# Patient Record
Sex: Male | Born: 1985 | Race: Black or African American | Hispanic: No | Marital: Single | Smoking: Current every day smoker
Health system: Southern US, Community
[De-identification: ages and names within clinical notes are randomized; demographics above are authoritative.]

## PROBLEM LIST (undated history)

## (undated) HISTORY — PX: TONSILLECTOMY: SUR1361

## (undated) HISTORY — PX: OTHER SURGICAL HISTORY: SHX169

---
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
## 2008-07-14 ENCOUNTER — Emergency Department: Payer: Self-pay | Admitting: Emergency Medicine

## 2008-07-16 ENCOUNTER — Emergency Department: Payer: Self-pay | Admitting: Emergency Medicine

## 2010-10-09 ENCOUNTER — Emergency Department: Payer: Self-pay | Admitting: Emergency Medicine

## 2012-01-25 ENCOUNTER — Emergency Department: Payer: Self-pay | Admitting: Emergency Medicine

## 2012-01-25 LAB — RAPID INFLUENZA A&B ANTIGENS

## 2012-06-24 ENCOUNTER — Emergency Department: Payer: Self-pay | Admitting: Emergency Medicine

## 2012-09-26 ENCOUNTER — Emergency Department: Payer: Self-pay | Admitting: Emergency Medicine

## 2012-09-29 ENCOUNTER — Emergency Department: Payer: Self-pay | Admitting: Emergency Medicine

## 2013-10-07 ENCOUNTER — Emergency Department: Payer: Self-pay | Admitting: Emergency Medicine

## 2013-10-10 ENCOUNTER — Emergency Department: Payer: Self-pay | Admitting: Emergency Medicine

## 2013-11-11 ENCOUNTER — Emergency Department: Payer: Self-pay | Admitting: Student

## 2013-12-12 ENCOUNTER — Emergency Department: Payer: Self-pay | Admitting: Emergency Medicine

## 2013-12-28 ENCOUNTER — Emergency Department: Payer: Self-pay | Admitting: Emergency Medicine

## 2014-01-20 ENCOUNTER — Emergency Department: Payer: Self-pay | Admitting: Emergency Medicine

## 2014-04-19 ENCOUNTER — Emergency Department
Admit: 2014-04-19 | Disposition: A | Discharge status: Home / Self Care | Payer: Self-pay | Admitting: Emergency Medicine

## 2014-04-22 ENCOUNTER — Emergency Department
Admit: 2014-04-22 | Disposition: A | Discharge status: Home / Self Care | Payer: Self-pay | Admitting: Emergency Medicine

## 2014-07-16 ENCOUNTER — Encounter: Payer: Self-pay | Admitting: Emergency Medicine

## 2014-07-16 ENCOUNTER — Emergency Department
Admission: EM | Admit: 2014-07-16 | Discharge: 2014-07-16 | Disposition: A | Discharge status: Home / Self Care | Payer: Self-pay | Attending: Emergency Medicine | Admitting: Emergency Medicine

## 2014-07-16 DIAGNOSIS — R42 Dizziness and giddiness: Secondary | ICD-10-CM | POA: Insufficient documentation

## 2014-07-16 DIAGNOSIS — Z720 Tobacco use: Secondary | ICD-10-CM | POA: Insufficient documentation

## 2014-07-16 DIAGNOSIS — J029 Acute pharyngitis, unspecified: Secondary | ICD-10-CM | POA: Insufficient documentation

## 2014-07-16 LAB — POCT RAPID STREP A: Streptococcus, Group A Screen (Direct): NEGATIVE

## 2014-07-16 NOTE — ED Notes (Signed)
Patient presents to the ED with complaint of sore throat, headache, and dizziness since yesterday.  Patient states the dizziness is intermittent.  Patient states the sore throat was worse when he first woke up this morning.  Patient is alert and oriented x 4 and is in no obvious distress at this time.  Patient ambulatory without difficulty.  Patient is not photophobic at this time.

## 2014-07-16 NOTE — ED Notes (Signed)
Pt states that he started with a sore throat yesterday, states some runny nose and cough at times, pt reports that when he woke up this am he felt worse

## 2014-07-16 NOTE — ED Provider Notes (Signed)
[facility] Emergency Department Provider Note  Time seen: 9:17 AM  I have reviewed the triage vital signs and the nursing notes.   HISTORY  Chief Complaint Sore Throat; Headache; and Dizziness    HPI Tony Conley is a 29 y.o. male with no past medical history who presents the emergency department with a sore throat and headache. According to the patient he developed a sore throat yesterday, was some pain with swallowing. He states this morning he felt a little dizzy with a headache in addition to the sore throat so he came to the emergency department for evaluation. Denies fever, neck pain, nausea, vomiting, photo or phonophobia. Patient describes the sore throat as moderate, worse with swallowing, dull.     History reviewed. No pertinent past medical history.  There are no active problems to display for this patient.   Past Surgical History  Procedure Laterality Date  . Tonsillectomy      No current outpatient prescriptions on file.  Allergies Review of patient's allergies indicates no known allergies.  No family history on file.  Social History History  Substance Use Topics  . Smoking status: Current Every Day Smoker -- 0.50 packs/day    Types: Cigarettes  . Smokeless tobacco: Not on file  . Alcohol Use: Not on file    Review of Systems Constitutional: Negative for fever. ENT: Negative for congestion. Positive for sore throat. Cardiovascular: Negative for chest pain. Respiratory: Negative for shortness of breath. Gastrointestinal: Negative for abdominal pain, vomiting and diarrhea. Musculoskeletal: Negative for back pain. Skin: Negative for rash. Neurological: Positive for mild headache. 10-point ROS otherwise negative.  ____________________________________________   PHYSICAL EXAM:  VITAL SIGNS: ED Triage Vitals  Enc Vitals Group     BP 07/16/14 0909 130/75 mmHg     Pulse Rate 07/16/14 0909 108     Resp 07/16/14 0909 16      Temp 07/16/14 0909 98.3 F (36.8 C)     Temp Source 07/16/14 0909 Oral     SpO2 07/16/14 0909 97 %     Weight 07/16/14 0909 135 lb (61.236 kg)     Height 07/16/14 0909 5\' 9"  (1.753 m)     Head Cir --      Peak Flow --      Pain Score 07/16/14 0910 7     Pain Loc --      Pain Edu? --      Excl. in GC? --     Constitutional: Alert and oriented. Well appearing and in no distress. Eyes: Normal exam ENT   Head: Normocephalic and atraumatic.   Nose: No congestion/rhinnorhea.   Mouth/Throat: Mucous membranes are moist. Mild pharyngeal erythema without exudate, no unilateral tonsillar swelling, no uvular deviation. Mild anterior cervical lymphadenopathy bilaterally. Cardiovascular: Normal rate, regular rhythm. No murmur Respiratory: Normal respiratory effort without tachypnea nor retractions. Breath sounds are clear and equal bilaterally. No wheezes/rales/rhonchi. Gastrointestinal: Soft and nontender. No distention.  Musculoskeletal: Nontender with normal range of motion in all extremities. Neurologic:  Normal speech and language. No gross focal neurologic deficits Skin:  Skin is warm, dry and intact.  Psychiatric: Mood and affect are normal. Speech and behavior are normal.  ____________________________________________   INITIAL IMPRESSION / ASSESSMENT AND PLAN / ED COURSE  Pertinent labs & imaging results that were available during my care of the patient were reviewed by me and considered in my medical decision making (see chart for details).  Patient with a good/normal exam besides mild pharyngeal  erythema, with mild anterior cervical lymphadenopathy. We will check a rapid strep. If positive we will treat with antibiotics, if negative I discussed supportive care with patient including Chloraseptic spray, Tylenol or Motrin as needed for discomfort, and increasing fluids. Patient is agreeable to plan.  ____________________________________________   FINAL CLINICAL  IMPRESSION(S) / ED DIAGNOSES  Pharyngitis   Minna Antis, MD 07/16/14 [phone]

## 2014-07-16 NOTE — Discharge Instructions (Signed)
General Headache Without Cause A headache is pain or discomfort felt around the head or neck area. The specific cause of a headache may not be found. There are many causes and types of headaches. A few common ones are:  Tension headaches.  Migraine headaches.  Cluster headaches.  Chronic daily headaches. HOME CARE INSTRUCTIONS   Keep all follow-up appointments with your caregiver or any specialist referral.  Only take over-the-counter or prescription medicines for pain or discomfort as directed by your caregiver.  Lie down in a dark, quiet room when you have a headache.  Keep a headache journal to find out what may trigger your migraine headaches. For example, write down:  What you eat and drink.  How much sleep you get.  Any change to your diet or medicines.  Try massage or other relaxation techniques.  Put ice packs or heat on the head and neck. Use these 3 to 4 times per day for 15 to 20 minutes each time, or as needed.  Limit stress.  Sit up straight, and do not tense your muscles.  Quit smoking if you smoke.  Limit alcohol use.  Decrease the amount of caffeine you drink, or stop drinking caffeine.  Eat and sleep on a regular schedule.  Get 7 to 9 hours of sleep, or as recommended by your caregiver.  Keep lights dim if bright lights bother you and make your headaches worse. SEEK MEDICAL CARE IF:   You have problems with the medicines you were prescribed.  Your medicines are not working.  You have a change from the usual headache.  You have nausea or vomiting. SEEK IMMEDIATE MEDICAL CARE IF:   Your headache becomes severe.  You have a fever.  You have a stiff neck.  You have loss of vision.  You have muscular weakness or loss of muscle control.  You start losing your balance or have trouble walking.  You feel faint or pass out.  You have severe symptoms that are different from your first symptoms. MAKE SURE YOU:   Understand these  instructions.  Will watch your condition.  Will get help right away if you are not doing well or get worse. Document Released: 12/23/2004 Document Revised: 03/17/2011 Document Reviewed: 01/08/2011 [facility] Patient Information 2015 [place], [place]. This information is not intended to replace advice given to you by your health care provider. Make sure you discuss any questions you have with your health care provider.  Pharyngitis Pharyngitis is a sore throat (pharynx). There is redness, pain, and swelling of your throat. HOME CARE   Drink enough fluids to keep your pee (urine) clear or pale yellow.  Only take medicine as told by your doctor.  You may get sick again if you do not take medicine as told. Finish your medicines, even if you start to feel better.  Do not take aspirin.  Rest.  Rinse your mouth (gargle) with salt water ( tsp of salt per 1 qt of water) every 1-2 hours. This will help the pain.  If you are not at risk for choking, you can suck on hard candy or sore throat lozenges. GET HELP IF:  You have large, tender lumps on your neck.  You have a rash.  You cough up green, yellow-brown, or bloody spit. GET HELP RIGHT AWAY IF:   You have a stiff neck.  You drool or cannot swallow liquids.  You throw up (vomit) or are not able to keep medicine or liquids down.  You have very  bad pain that does not go away with medicine.  You have problems breathing (not from a stuffy nose). MAKE SURE YOU:   Understand these instructions.  Will watch your condition.  Will get help right away if you are not doing well or get worse. Document Released: 06/11/2007 Document Revised: 10/13/2012 Document Reviewed: 08/30/2012 [facility] Patient Information 2015 [place], [place]. This information is not intended to replace advice given to you by your health care provider. Make sure you discuss any questions you have with your health care provider.    Please follow-up with your primary  care provider this week for recheck. Please use Tylenol or Motrin as needed for fever or discomfort, as written on the box. Please drink plenty of fluids over the next several days, and use over-the-counter Chloraseptic spray for symptom relief. Return to the emergency department for any worsening pain, fever, worsening headache, or any other symptom personally concerning to your self.

## 2014-07-18 LAB — CULTURE, GROUP A STREP (THRC)

## 2014-10-21 ENCOUNTER — Encounter: Payer: Self-pay | Admitting: Emergency Medicine

## 2014-10-21 ENCOUNTER — Emergency Department
Admission: EM | Admit: 2014-10-21 | Discharge: 2014-10-21 | Disposition: A | Discharge status: Home / Self Care | Payer: Self-pay | Attending: Emergency Medicine | Admitting: Emergency Medicine

## 2014-10-21 DIAGNOSIS — Z720 Tobacco use: Secondary | ICD-10-CM | POA: Insufficient documentation

## 2014-10-21 DIAGNOSIS — J029 Acute pharyngitis, unspecified: Secondary | ICD-10-CM

## 2014-10-21 DIAGNOSIS — J069 Acute upper respiratory infection, unspecified: Secondary | ICD-10-CM | POA: Insufficient documentation

## 2014-10-21 DIAGNOSIS — B349 Viral infection, unspecified: Secondary | ICD-10-CM | POA: Insufficient documentation

## 2014-10-21 LAB — POCT RAPID STREP A: Streptococcus, Group A Screen (Direct): NEGATIVE

## 2014-10-21 MED ORDER — PSEUDOEPH-BROMPHEN-DM 30-2-10 MG/5ML PO SYRP: 5.0000 mL | ORAL_SOLUTION | Freq: Four times a day (QID) | ORAL | Status: DC | PRN

## 2014-10-21 MED ORDER — LIDOCAINE VISCOUS 2 % MT SOLN: 20.0000 mL | OROMUCOSAL | Status: DC | PRN

## 2014-10-21 NOTE — ED Provider Notes (Signed)
[facility] Emergency Department Provider Note  ____________________________________________  Time seen: Approximately 11:23 AM  I have reviewed the triage vital signs and the nursing notes.   HISTORY  Chief Complaint Sore Throat    HPI Tony Conley is a 29 y.o. male patient complaining of sore throat and nasal congestion for 2 days. Patient also stated this been coughing. Patient denies any nausea vomiting diarrhea. Patient denies any fever or chills associated this complaint. Patient rating his sore throat pain as a 2 over 3. Patient is able to tolerate food and fluids. No palliative measures taken for this complaint.   History reviewed. No pertinent past medical history.  There are no active problems to display for this patient.   Past Surgical History  Procedure Laterality Date  . Tonsillectomy      Current Outpatient Rx  Name  Route  Sig  Dispense  Refill  . brompheniramine-pseudoephedrine-DM 30-2-10 MG/5ML syrup   Oral   Take 5 mLs by mouth 4 (four) times daily as needed.   120 mL   0   . lidocaine (XYLOCAINE) 2 % solution   Mouth/Throat   Use as directed 20 mLs in the mouth or throat as needed for mouth pain.   100 mL   0     Allergies Review of patient's allergies indicates no known allergies.  History reviewed. No pertinent family history.  Social History Social History  Substance Use Topics  . Smoking status: Current Every Day Smoker -- 0.50 packs/day    Types: Cigarettes  . Smokeless tobacco: None  . Alcohol Use: None    Review of Systems Constitutional: No fever/chills Eyes: No visual changes. ENT: Sore throat. Sinus congestion. Cardiovascular: Denies chest pain. Respiratory: Denies shortness of breath. Gastrointestinal: No abdominal pain.  No nausea, no vomiting.  No diarrhea.  No constipation. Genitourinary: Negative for dysuria. Musculoskeletal: Negative for back pain. Skin: Negative for  rash. Neurological: Negative for headaches, focal weakness or numbness. 10-point ROS otherwise negative.  ____________________________________________   PHYSICAL EXAM:  VITAL SIGNS: ED Triage Vitals  Enc Vitals Group     BP 10/21/14 1052 113/60 mmHg     Pulse Rate 10/21/14 1052 84     Resp 10/21/14 1052 16     Temp 10/21/14 1052 98.9 F (37.2 C)     Temp Source 10/21/14 1052 Oral     SpO2 10/21/14 1052 97 %     Weight 10/21/14 1052 144 lb (65.318 kg)     Height 10/21/14 1052  (1.727 m)     Head Cir --      Peak Flow --      Pain Score 10/21/14 1027 2     Pain Loc --      Pain Edu? --      Excl. in GC? --     Constitutional: Alert and oriented. Well appearing and in no acute distress. Eyes: Conjunctivae are normal. PERRL. EOMI. Head: Atraumatic. Nose: Nasal congestion/rhinnorhea. Edematous nasal turbinates. No guarding palpation of sinus. Mouth/Throat: Mucous membranes are moist.  Oropharynx erythematous. Neck: No stridor.  No cervical spine tenderness to palpation. Hematological/Lymphatic/Immunilogical: No cervical lymphadenopathy. Cardiovascular: Normal rate, regular rhythm. Grossly normal heart sounds.  Good peripheral circulation. Respiratory: Normal respiratory effort.  No retractions. Lungs CTAB. Gastrointestinal: Soft and nontender. No distention. No abdominal bruits. No CVA tenderness. Musculoskeletal: No lower extremity tenderness nor edema.  No joint effusions. Neurologic:  Normal speech and language. No gross focal neurologic deficits are appreciated. No  gait instability. Skin:  Skin is warm, dry and intact. No rash noted. Psychiatric: Mood and affect are normal. Speech and behavior are normal.  ____________________________________________   LABS (all labs ordered are listed, but only abnormal results are displayed)  Labs Reviewed - No data to  display ____________________________________________  EKG   ____________________________________________  RADIOLOGY   ____________________________________________   PROCEDURES  Procedure(s) performed: None  Critical Care performed: No  ____________________________________________   INITIAL IMPRESSION / ASSESSMENT AND PLAN / ED COURSE  Pertinent labs & imaging results that were available during my care of the patient were reviewed by me and considered in my medical decision making (see chart for details).  Respiratory infection and pharyngitis. Discussed negative rapid strep findings with patient advised culture is pending. Patient given a prescription for Bromfed-DM and viscous lidocaine. Patient given a work note for today. Patient [facility]. ____________________________________________   FINAL CLINICAL IMPRESSION(S) / ED DIAGNOSES  Final diagnoses:  URI (upper respiratory infection)  Pharyngitis with viral syndrome      Joni ReiningRonald K Eliah Ozawa, PA-C 10/21/14 1156  Phineas SemenGraydon Goodman, MD 10/21/14 1240

## 2014-10-21 NOTE — ED Notes (Signed)
Reports sore throat and congestion. No resp distress

## 2014-10-21 NOTE — ED Notes (Signed)
NAD noted at time of D/C. Pt denies questions or concerns. Pt ambulatory to the lobby at this time.  

## 2014-11-03 ENCOUNTER — Encounter: Payer: Self-pay | Admitting: Emergency Medicine

## 2014-11-03 ENCOUNTER — Emergency Department
Admission: EM | Admit: 2014-11-03 | Discharge: 2014-11-03 | Disposition: A | Discharge status: Home / Self Care | Payer: Self-pay | Attending: Emergency Medicine | Admitting: Emergency Medicine

## 2014-11-03 DIAGNOSIS — Z720 Tobacco use: Secondary | ICD-10-CM | POA: Insufficient documentation

## 2014-11-03 DIAGNOSIS — L03116 Cellulitis of left lower limb: Secondary | ICD-10-CM | POA: Insufficient documentation

## 2014-11-03 MED ORDER — SULFAMETHOXAZOLE-TRIMETHOPRIM 800-160 MG PO TABS: 1.0000 | ORAL_TABLET | Freq: Two times a day (BID) | ORAL | Status: DC

## 2014-11-03 NOTE — Discharge Instructions (Signed)

## 2014-11-03 NOTE — ED Notes (Signed)
Pt to ed with c/o ? Bug bite to left upper thigh on the back of the leg.  Pt states he noticed area last Saturday and no relief of pain since.

## 2014-11-03 NOTE — ED Provider Notes (Signed)
[facility] Emergency Department Analiya Porco Note  ____________________________________________  Time seen: Approximately 3:53 PM  I have reviewed the triage vital signs and the nursing notes.   HISTORY  Chief Complaint Insect Bite    HPI Tony Conley is a 29 y.o. male who presents to the emergency department for evaluation of a bite or abscess that is on his left posterior thigh.He states that he has not noticed any drainage. The area started 6 days ago. He denies fever or previous skin infections.   History reviewed. No pertinent past medical history.  There are no active problems to display for this patient.   Past Surgical History  Procedure Laterality Date  . Tonsillectomy      Current Outpatient Rx  Name  Route  Sig  Dispense  Refill  . brompheniramine-pseudoephedrine-DM 30-2-10 MG/5ML syrup   Oral   Take 5 mLs by mouth 4 (four) times daily as needed.   120 mL   0   . lidocaine (XYLOCAINE) 2 % solution   Mouth/Throat   Use as directed 20 mLs in the mouth or throat as needed for mouth pain.   100 mL   0   . sulfamethoxazole-trimethoprim (BACTRIM DS,SEPTRA DS) 800-160 MG tablet   Oral   Take 1 tablet by mouth 2 (two) times daily.   20 tablet   0     Allergies Review of patient's allergies indicates no known allergies.  History reviewed. No pertinent family history.  Social History Social History  Substance Use Topics  . Smoking status: Current Every Day Smoker -- 0.50 packs/day    Types: Cigarettes  . Smokeless tobacco: None  . Alcohol Use: No    Review of Systems   Constitutional: No fever/chills Eyes: No visual changes. ENT: No congestion or rhinorrhea Cardiovascular: Denies chest pain. Respiratory: Denies shortness of breath. Gastrointestinal: No abdominal pain.  No nausea, no vomiting.  No diarrhea.  No constipation. Genitourinary: Negative for dysuria. Musculoskeletal: Negative for back pain. Skin: Positive  for abscess on the left thigh Neurological: Negative for headaches, focal weakness or numbness.  10-point ROS otherwise negative.  ____________________________________________   PHYSICAL EXAM:  VITAL SIGNS: ED Triage Vitals  Enc Vitals Group     BP 11/03/14 0950 110/69 mmHg     Pulse Rate 11/03/14 0950 87     Resp 11/03/14 0950 20     Temp 11/03/14 0950 98.2 F (36.8 C)     Temp Source 11/03/14 0950 Oral     SpO2 11/03/14 0950 100 %     Weight 11/03/14 0950 145 lb (65.772 kg)     Height 11/03/14 0950 5\' 9"  (1.753 m)     Head Cir --      Peak Flow --      Pain Score 11/03/14 0951 10     Pain Loc --      Pain Edu? --      Excl. in GC? --     Constitutional: Alert and oriented. Well appearing and in no acute distress. Eyes: Conjunctivae are normal. PERRL. EOMI. Head: Atraumatic. Nose: No congestion/rhinnorhea. Mouth/Throat: Mucous membranes are moist.  Oropharynx non-erythematous. No oral lesions. Neck: No stridor. Cardiovascular: Normal rate, regular rhythm.  Good peripheral circulation. Respiratory: Normal respiratory effort.  No retractions. Lungs CTAB. Gastrointestinal: Soft and nontender. No distention. No abdominal bruits.  Musculoskeletal: No lower extremity tenderness nor edema.  No joint effusions. Neurologic:  Normal speech and language. No gross focal neurologic deficits are appreciated. Speech is  normal. No gait instability. Skin:  Approximately 1 cm indurated area with mild erythema and a small pinpoint opening in the center located on the posterior aspect of his left upper thigh.; Negative for petechiae.  Psychiatric: Mood and affect are normal. Speech and behavior are normal.  ____________________________________________   LABS (all labs ordered are listed, but only abnormal results are displayed)  Labs Reviewed - No data to  display ____________________________________________  EKG   ____________________________________________  RADIOLOGY   ____________________________________________   PROCEDURES  Procedure(s) performed: None ____________________________________________   INITIAL IMPRESSION / ASSESSMENT AND PLAN / ED COURSE  Pertinent labs & imaging results that were available during my care of the patient were reviewed by me and considered in my medical decision making (see chart for details).  No indication for I and D today.  Patient was advised to follow up with the primary care Abby Stines for symptoms that are not improving over the next 2-3 days. He was advised to return to the emergency department for symptoms that change or worsen if unable to schedule an appointment with the primary care Atlanta Pelto or specialist. ____________________________________________   FINAL CLINICAL IMPRESSION(S) / ED DIAGNOSES  Final diagnoses:  Cellulitis of left thigh       Chinita Pester, FNP 11/03/14 1556  Phineas Semen, MD 11/04/14 [phone]

## 2015-03-08 ENCOUNTER — Emergency Department
Admission: EM | Admit: 2015-03-08 | Discharge: 2015-03-08 | Disposition: A | Discharge status: Home / Self Care | Payer: Self-pay | Attending: Emergency Medicine | Admitting: Emergency Medicine

## 2015-03-08 DIAGNOSIS — F17210 Nicotine dependence, cigarettes, uncomplicated: Secondary | ICD-10-CM | POA: Insufficient documentation

## 2015-03-08 DIAGNOSIS — Z792 Long term (current) use of antibiotics: Secondary | ICD-10-CM | POA: Insufficient documentation

## 2015-03-08 DIAGNOSIS — H6503 Acute serous otitis media, bilateral: Secondary | ICD-10-CM | POA: Insufficient documentation

## 2015-03-08 DIAGNOSIS — J0110 Acute frontal sinusitis, unspecified: Secondary | ICD-10-CM | POA: Insufficient documentation

## 2015-03-08 MED ORDER — AMOXICILLIN 500 MG PO CAPS: 500.0000 mg | ORAL_CAPSULE | Freq: Three times a day (TID) | ORAL | Status: DC

## 2015-03-08 MED ORDER — FLUTICASONE PROPIONATE 50 MCG/ACT NA SUSP: 2.0000 | Freq: Every day | NASAL | Status: DC

## 2015-03-08 NOTE — Discharge Instructions (Signed)
Serous Otitis Media Serous otitis media is fluid in the middle ear space. This space contains the bones for hearing and air. Air in the middle ear space helps to transmit sound.  The air gets there through the eustachian tube. This tube goes from the back of the nose (nasopharynx) to the middle ear space. It keeps the pressure in the middle ear the same as the outside world. It also helps to drain fluid from the middle ear space. CAUSES  Serous otitis media occurs when the eustachian tube gets blocked. Blockage can come from:  Ear infections.  Colds and other upper respiratory infections.  Allergies.  Irritants such as cigarette smoke.  Sudden changes in air pressure (such as descending in an airplane).  Enlarged adenoids.  A mass in the nasopharynx. During colds and upper respiratory infections, the middle ear space can become temporarily filled with fluid. This can happen after an ear infection also. Once the infection clears, the fluid will generally drain out of the ear through the eustachian tube. If it does not, then serous otitis media occurs. SIGNS AND SYMPTOMS   Hearing loss.  A feeling of fullness in the ear, without pain.  Young children may not show any symptoms but may show slight behavioral changes, such as agitation, ear pulling, or crying. DIAGNOSIS  Serous otitis media is diagnosed by an ear exam. Tests may be done to check on the movement of the eardrum. Hearing exams may also be done. TREATMENT  The fluid most often goes away without treatment. If allergy is the cause, allergy treatment may be helpful. Fluid that persists for several months may require minor surgery. A small tube is placed in the eardrum to:  Drain the fluid.  Restore the air in the middle ear space. In certain situations, antibiotic medicines are used to avoid surgery. Surgery may be done to remove enlarged adenoids (if this is the cause). HOME CARE INSTRUCTIONS   Keep children away from  tobacco smoke.  Keep all follow-up visits as directed by your health care provider. SEEK MEDICAL CARE IF:   Your hearing is not better in 3 months.  Your hearing is worse.  You have ear pain.  You have drainage from the ear.  You have dizziness.  You have serous otitis media only in one ear or have any bleeding from your nose (epistaxis).  You notice a lump on your neck. MAKE SURE YOU:  Understand these instructions.   Will watch your condition.   Will get help right away if you are not doing well or get worse.    This information is not intended to replace advice given to you by your health care provider. Make sure you discuss any questions you have with your health care provider.   Document Released: 03/15/2003 Document Revised: 01/13/2014 Document Reviewed: 07/20/2012 Elsevier Interactive Patient Education 2016 [employer] Inc.  Sinus Rinse WHAT IS A SINUS RINSE? A sinus rinse is a simple home treatment that is used to rinse your sinuses with a sterile mixture of salt and water (saline solution). Sinuses are air-filled spaces in your skull behind the bones of your face and forehead that open into your nasal cavity. You will use the following:  Saline solution.  Neti pot or spray bottle. This releases the saline solution into your nose and through your sinuses. Neti pots and spray bottles can be purchased at [employer], a health food store, or online. WHEN WOULD I DO A SINUS RINSE? A sinus rinse  can help to clear mucus, dirt, dust, or pollen from the nasal cavity. You may do a sinus rinse when you have a cold, a virus, nasal allergy symptoms, a sinus infection, or stuffiness in the nose or sinuses. If you are considering a sinus rinse:  Ask your child's health care provider before performing a sinus rinse on your child.  Do not do a sinus rinse if you have had ear or nasal surgery, ear infection, or blocked ears. HOW DO I DO A SINUS RINSE?  Wash your  hands.  Disinfect your device according to the directions provided and then dry it.  Use the solution that comes with your device or one that is sold separately in stores. Follow the mixing directions on the package.  Fill your device with the amount of saline solution as directed by the device instructions.  Stand over a sink and tilt your head sideways over the sink.  Place the spout of the device in your upper nostril (the one closer to the ceiling).  Gently pour or squeeze the saline solution into the nasal cavity. The liquid should drain to the lower nostril if you are not overly congested.  Gently blow your nose. Blowing too hard may cause ear pain.  Repeat in the other nostril.  Clean and rinse your device with clean water and then air-dry it. ARE THERE RISKS OF A SINUS RINSE?  Sinus rinse is generally very safe and effective. However, there are a few risks, which include:   A burning sensation in the sinuses. This may happen if you do not make the saline solution as directed. Make sure to follow all directions when making the saline solution.  Infection from contaminated water. This is rare, but possible.  Nasal irritation.   This information is not intended to replace advice given to you by your health care provider. Make sure you discuss any questions you have with your health care provider.   Document Released: 07/20/2013 Document Reviewed: 07/20/2013 Elsevier Interactive Patient Education 2016 [employer].  Sinusitis, Adult Sinusitis is redness, soreness, and inflammation of the paranasal sinuses. Paranasal sinuses are air pockets within the bones of your face. They are located beneath your eyes, in the middle of your forehead, and above your eyes. In healthy paranasal sinuses, mucus is able to drain out, and air is able to circulate through them by way of your nose. However, when your paranasal sinuses are inflamed, mucus and air can become trapped. This can allow  bacteria and other germs to grow and cause infection. Sinusitis can develop quickly and last only a short time (acute) or continue over a long period (chronic). Sinusitis that lasts for more than 12 weeks is considered chronic. CAUSES Causes of sinusitis include:  Allergies.  Structural abnormalities, such as displacement of the cartilage that separates your nostrils (deviated septum), which can decrease the air flow through your nose and sinuses and affect sinus drainage.  Functional abnormalities, such as when the small hairs (cilia) that line your sinuses and help remove mucus do not work properly or are not present. SIGNS AND SYMPTOMS Symptoms of acute and chronic sinusitis are the same. The primary symptoms are pain and pressure around the affected sinuses. Other symptoms include:  Upper toothache.  Earache.  Headache.  Bad breath.  Decreased sense of smell and taste.  A cough, which worsens when you are lying flat.  Fatigue.  Fever.  Thick drainage from your nose, which often is green and may contain pus (  purulent).  Swelling and warmth over the affected sinuses. DIAGNOSIS Your health care provider will perform a physical exam. During your exam, your health care provider may perform any of the following to help determine if you have acute sinusitis or chronic sinusitis:  Look in your nose for signs of abnormal growths in your nostrils (nasal polyps).  Tap over the affected sinus to check for signs of infection.  View the inside of your sinuses using an imaging device that has a light attached (endoscope). If your health care provider suspects that you have chronic sinusitis, one or more of the following tests may be recommended:  Allergy tests.  Nasal culture. A sample of mucus is taken from your nose, sent to a lab, and screened for bacteria.  Nasal cytology. A sample of mucus is taken from your nose and examined by your health care provider to determine if your  sinusitis is related to an allergy. TREATMENT Most cases of acute sinusitis are related to a viral infection and will resolve on their own within 10 days. Sometimes, medicines are prescribed to help relieve symptoms of both acute and chronic sinusitis. These may include pain medicines, decongestants, nasal steroid sprays, or saline sprays. However, for sinusitis related to a bacterial infection, your health care provider will prescribe antibiotic medicines. These are medicines that will help kill the bacteria causing the infection. Rarely, sinusitis is caused by a fungal infection. In these cases, your health care provider will prescribe antifungal medicine. For some cases of chronic sinusitis, surgery is needed. Generally, these are cases in which sinusitis recurs more than 3 times per year, despite other treatments. HOME CARE INSTRUCTIONS  Drink plenty of water. Water helps thin the mucus so your sinuses can drain more easily.  Use a humidifier.  Inhale steam 3-4 times a day (for example, sit in the bathroom with the shower running).  Apply a warm, moist washcloth to your face 3-4 times a day, or as directed by your health care provider.  Use saline nasal sprays to help moisten and clean your sinuses.  Take medicines only as directed by your health care provider.  If you were prescribed either an antibiotic or antifungal medicine, finish it all even if you start to feel better. SEEK IMMEDIATE MEDICAL CARE IF:  You have increasing pain or severe headaches.  You have nausea, vomiting, or drowsiness.  You have swelling around your face.  You have vision problems.  You have a stiff neck.  You have difficulty breathing.   This information is not intended to replace advice given to you by your health care provider. Make sure you discuss any questions you have with your health care provider.   Document Released: 12/23/2004 Document Revised: 01/13/2014 Document Reviewed:  01/07/2011 Elsevier Interactive Patient Education [employer]   Take the antibiotic as directed. Start a daily allergy medicine + decongestant (pseudoephedrine) for sinus pressures and drainage. Rinse the sinuses with saline solution.

## 2015-03-08 NOTE — ED Notes (Signed)
Pt a/o. Left ear pain. No other symptoms. Ear drum red.

## 2015-03-08 NOTE — ED Notes (Signed)
Pt c/o left ear pain for the past week. 

## 2015-03-13 NOTE — ED Provider Notes (Signed)
[facility] Emergency Department Provider Note ____________________________________________  Time seen: 1048  I have reviewed the triage vital signs and the nursing notes.  HISTORY  Chief Complaint  Otalgia  He denies HPI Tony Conley is a 30 y.o. male sensitivity for language and left ear pain for the last week. He denies any interim fevers, chills, sweats. He denies any drainage from the ear or any irritation with manipulating the ear canal. He does note some sinus congestion and some mild frontal headache pain.He rates his discomfort a 6/10 in triage.  No past medical history on file.  There are no active problems to display for this patient.   Past Surgical History  Procedure Laterality Date  . Tonsillectomy      Current Outpatient Rx  Name  Route  Sig  Dispense  Refill  . amoxicillin (AMOXIL) 500 MG capsule   Oral   Take 1 capsule (500 mg total) by mouth 3 (three) times daily.   30 capsule   0   . brompheniramine-pseudoephedrine-DM 30-2-10 MG/5ML syrup   Oral   Take 5 mLs by mouth 4 (four) times daily as needed.   120 mL   0   . fluticasone (FLONASE) 50 MCG/ACT nasal spray   Each Nare   Place 2 sprays into both nostrils daily.   16 g   0   . lidocaine (XYLOCAINE) 2 % solution   Mouth/Throat   Use as directed 20 mLs in the mouth or throat as needed for mouth pain.   100 mL   0   . sulfamethoxazole-trimethoprim (BACTRIM DS,SEPTRA DS) 800-160 MG tablet   Oral   Take 1 tablet by mouth 2 (two) times daily.   20 tablet   0     Allergies Review of patient's allergies indicates no known allergies.  No family history on file.  Social History Social History  Substance Use Topics  . Smoking status: Current Every Day Smoker -- 0.50 packs/day    Types: Cigarettes  . Smokeless tobacco: Not on file  . Alcohol Use: No    Review of Systems  Constitutional: Negative for fever. Eyes: Negative for visual changes. ENT: Negative  for sore throat. Left ear pain as above. Cardiovascular: Negative for chest pain. Respiratory: Negative for shortness of breath. Gastrointestinal: Negative for abdominal pain, vomiting and diarrhea. Genitourinary: Negative for dysuria. Musculoskeletal: Negative for back pain. Skin: Negative for rash. Neurological: Negative for headaches, focal weakness or numbness. ____________________________________________  PHYSICAL EXAM:  VITAL SIGNS: ED Triage Vitals  Enc Vitals Group     BP 03/08/15 1013 120/62 mmHg     Pulse Rate 03/08/15 1013 71     Resp 03/08/15 1013 18     Temp 03/08/15 1013 97.7 F (36.5 C)     Temp Source 03/08/15 1013 Oral     SpO2 03/08/15 1013 98 %     Weight 03/08/15 1013 145 lb (65.772 kg)     Height 03/08/15 1013  (1.753 m)     Head Cir --      Peak Flow --      Pain Score 03/08/15 1017 6     Pain Loc --      Pain Edu? --      Excl. in GC? --    Constitutional: Alert and oriented. Well appearing and in no distress. Head: Normocephalic and atraumatic. Tender to palp over the frontal & maxillary sinuses.      Eyes: Conjunctivae are normal. PERRL. Normal  extraocular movements      Ears: Canals clear. TMs intact bilaterally. Bilateral TMs erythematous, bulging, with serous effusion noted.   Nose: No congestion/rhinorrhea.   Mouth/Throat: Mucous membranes are moist.   Neck: Supple. No thyromegaly. Hematological/Lymphatic/Immunological: No cervical lymphadenopathy. Cardiovascular: Normal rate, regular rhythm.  Respiratory: Normal respiratory effort. No wheezes/rales/rhonchi. Musculoskeletal: Nontender with normal range of motion in all extremities.  Neurologic:  Normal gait without ataxia. Normal speech and language. No gross focal neurologic deficits are appreciated. Skin:  Skin is warm, dry and intact. No rash noted. Psychiatric: Mood and affect are normal. Patient exhibits appropriate insight and  judgment. ____________________________________________  INITIAL IMPRESSION / ASSESSMENT AND PLAN / ED COURSE  Patient with acute frontal sinusitis and bilateral serous effusion. We treated with prescriptions for Flonase and amoxicillin to dose as directed. Follow-up with his primary care provider for ongoing symptom management. ____________________________________________  FINAL CLINICAL IMPRESSION(S) / ED DIAGNOSES  Final diagnoses:  Acute frontal sinusitis, recurrence not specified  Acute serous otitis media of both ears without rupture      Lissa HoardJenise V Bacon Myrakle Wingler, PA-C 03/13/15 0135  Sharman CheekPhillip Stafford, MD 03/13/15 1526

## 2015-06-11 ENCOUNTER — Emergency Department
Admission: EM | Admit: 2015-06-11 | Discharge: 2015-06-11 | Disposition: A | Discharge status: Home / Self Care | Payer: Self-pay | Attending: Emergency Medicine | Admitting: Emergency Medicine

## 2015-06-11 ENCOUNTER — Encounter: Payer: Self-pay | Admitting: Emergency Medicine

## 2015-06-11 DIAGNOSIS — F17210 Nicotine dependence, cigarettes, uncomplicated: Secondary | ICD-10-CM | POA: Insufficient documentation

## 2015-06-11 DIAGNOSIS — X500XXA Overexertion from strenuous movement or load, initial encounter: Secondary | ICD-10-CM | POA: Insufficient documentation

## 2015-06-11 DIAGNOSIS — S63501A Unspecified sprain of right wrist, initial encounter: Secondary | ICD-10-CM | POA: Insufficient documentation

## 2015-06-11 DIAGNOSIS — Y9389 Activity, other specified: Secondary | ICD-10-CM | POA: Insufficient documentation

## 2015-06-11 DIAGNOSIS — Y990 Civilian activity done for income or pay: Secondary | ICD-10-CM | POA: Insufficient documentation

## 2015-06-11 DIAGNOSIS — Y929 Unspecified place or not applicable: Secondary | ICD-10-CM | POA: Insufficient documentation

## 2015-06-11 MED ORDER — IBUPROFEN 800 MG PO TABS: 800.0000 mg | ORAL_TABLET | Freq: Three times a day (TID) | ORAL | Status: DC | PRN

## 2015-06-11 NOTE — ED Notes (Signed)
R wrist pain x 2 weeks, denies specific injury but states does a lot of lifting at work.

## 2015-06-11 NOTE — Discharge Instructions (Signed)

## 2015-06-11 NOTE — ED Notes (Signed)
Having pain to right wrist area for about 3-4 weeks  Denies any specific injury but states pain increases with movement  No swelling noted

## 2015-06-11 NOTE — ED Provider Notes (Addendum)
[facility] Emergency Department Provider Note  ____________________________________________  Time seen: Approximately 11:39 AM  I have reviewed the triage vital signs and the nursing notes.   HISTORY  Chief Complaint Wrist Pain    HPI Tony Conley is a 30 y.o. male presents for evaluation of right wrist pain for 3-4 weeks. Patient states that it does a lot of lifting at work and pain is worse when lifting. No pain at rest. No pain at this visit. States he's tried Tylenol over-the-counter for the pain. It and it's about a 6/10 at the peak.   History reviewed. No pertinent past medical history.  There are no active problems to display for this patient.   Past Surgical History  Procedure Laterality Date  . Tonsillectomy    . Tubes in ears      Current Outpatient Rx  Name  Route  Sig  Dispense  Refill  . ibuprofen (ADVIL,MOTRIN) 800 MG tablet   Oral   Take 1 tablet (800 mg total) by mouth every 8 (eight) hours as needed.   30 tablet   0     Allergies Review of patient's allergies indicates no known allergies.  No family history on file.  Social History Social History  Substance Use Topics  . Smoking status: Current Every Day Smoker -- 0.50 packs/day    Types: Cigarettes  . Smokeless tobacco: None  . Alcohol Use: No    Review of Systems Constitutional: No fever/chills Musculoskeletal: Positive for right wrist pain. Skin: Negative for rash. Neurological: Negative for headaches, focal weakness or numbness.  10-point ROS otherwise negative.  ____________________________________________   PHYSICAL EXAM:  VITAL SIGNS: ED Triage Vitals  Enc Vitals Group     BP 06/11/15 1004 121/87 mmHg     Pulse Rate 06/11/15 1004 81     Resp 06/11/15 1004 20     Temp 06/11/15 1004 98.3 F (36.8 C)     Temp src --      SpO2 06/11/15 1004 97 %     Weight 06/11/15 1004 145 lb (65.772 kg)     Height 06/11/15 1004 5\' 9"  (1.753 m)     Head Cir  --      Peak Flow --      Pain Score 06/11/15 1005 6     Pain Loc --      Pain Edu? --      Excl. in GC? --     Constitutional: Alert and oriented. Well appearing and in no acute distress. Musculoskeletal: Right wrist for range of motion distally neurovascularly intact. Negative Tinel's. Neurologic:  Normal speech and language. No gross focal neurologic deficits are appreciated. No gait instability. Skin:  Skin is warm, dry and intact. No rash noted. Psychiatric: Mood and affect are normal. Speech and behavior are normal.  ____________________________________________   LABS (all labs ordered are listed, but only abnormal results are displayed)  Labs Reviewed - No data to display ____________________________________________  EKG   ____________________________________________  RADIOLOGY  Deferred ____________________________________________   PROCEDURES  Procedure(s) performed: None  Critical Care performed: No  ____________________________________________   INITIAL IMPRESSION / ASSESSMENT AND PLAN / ED COURSE  Pertinent labs & imaging results that were available during my care of the patient were reviewed by me and considered in my medical decision making (see chart for details).  Right wrist pain. Probably secondary to overuse syndrome. Reassurance provided Rx given for Motrin 800 mg 3 times a day encouraged use of the wrist  splint as needed. ORTHOPEDICS if no better in 2-3 weeks ____________________________________________   FINAL CLINICAL IMPRESSION(S) / ED DIAGNOSES  Final diagnoses:  Wrist sprain, right, initial encounter     This chart was dictated using voice recognition software/Dragon. Despite best efforts to proofread, errors can occur which can change the meaning. Any change was purely unintentional.   Evangeline Dakin, PA-C 06/11/15 1610  Jene Every, MD 06/12/15 1253  Charmayne Sheer Beers, PA-C 06/26/15 1642  Jene Every, MD 07/02/15  [phone]

## 2015-07-30 ENCOUNTER — Emergency Department
Admission: EM | Admit: 2015-07-30 | Discharge: 2015-07-30 | Disposition: A | Discharge status: Home / Self Care | Payer: Self-pay | Attending: Emergency Medicine | Admitting: Emergency Medicine

## 2015-07-30 DIAGNOSIS — F17210 Nicotine dependence, cigarettes, uncomplicated: Secondary | ICD-10-CM | POA: Insufficient documentation

## 2015-07-30 DIAGNOSIS — H6692 Otitis media, unspecified, left ear: Secondary | ICD-10-CM | POA: Insufficient documentation

## 2015-07-30 MED ORDER — AMOXICILLIN 875 MG PO TABS: 875.0000 mg | ORAL_TABLET | Freq: Two times a day (BID) | ORAL | 0 refills | Status: DC

## 2015-07-30 MED ORDER — FLUTICASONE PROPIONATE 50 MCG/ACT NA SUSP: 2.0000 | Freq: Every day | NASAL | 0 refills | Status: DC

## 2015-07-30 NOTE — ED Triage Notes (Signed)
Pt c/o left ear pain for the past week.

## 2015-07-30 NOTE — ED Notes (Signed)
Having pain to left ear for about 1 week  No fever or trauma

## 2015-07-30 NOTE — ED Provider Notes (Signed)
[facility] Emergency Department Provider Note  ____________________________________________  Time seen: Approximately 11:18 AM  I have reviewed the triage vital signs and the nursing notes.   HISTORY  Chief Complaint Otalgia    HPI Tony Conley is a 30 y.o. male , NAD, presents to the emergency department with 1 week history of left ear pain and decreased hearing. States his hearing feels muffled and has sharp shooting pains in the ear at times. Denies any injury or trauma to the ear. Has not noted any discharge from the ear. No recent upper respiratory symptoms nor allergies. Has not had any fevers, chills, body aches. Has not been taking anything over-the-counter for her symptoms at this time.   History reviewed. No pertinent past medical history.  There are no active problems to display for this patient.   Past Surgical History:  Procedure Laterality Date  . TONSILLECTOMY    . tubes in ears      Current Outpatient Rx  . Order #: [id number] Class: Print  . Order #: [id number] Class: Print  . Order #: [id number] Class: Print    Allergies Review of patient's allergies indicates no known allergies.  No family history on file.  Social History Social History  Substance Use Topics  . Smoking status: Current Every Day Smoker    Packs/day: 0.50    Types: Cigarettes  . Smokeless tobacco: Never Used  . Alcohol use No     Review of Systems  Constitutional: No fever/chills, Fatigue Eyes: No visual changes. No discharge ENT: Positive ear pain. No sore throat, nasal congestion, runny nose, ear discharge, sinus pressure. Cardiovascular: No chest pain. Respiratory: No shortness of breath.  Musculoskeletal: Negative for general myalgias.  Skin: Negative for rash. Neurological: Negative for headaches, focal weakness or numbness. 10-point ROS otherwise negative.  ____________________________________________   PHYSICAL EXAM:  VITAL SIGNS: ED  Triage Vitals [07/30/15 1057]  Enc Vitals Group     BP 113/73     Pulse Rate 87     Resp 17     Temp 97.9 F (36.6 C)     Temp Source Oral     SpO2 97 %     Weight 145 lb (65.8 kg)     Height 5\' 9"  (1.753 m)     Head Circumference      Peak Flow      Pain Score 5     Pain Loc      Pain Edu?      Excl. in GC?      Constitutional: Alert and oriented. Well appearing and in no acute distress. Eyes: Conjunctivae are normal.  Head: Atraumatic. ENT:      Ears: Left TM visualized with moderate erythema, mild bulging and moderate effusion without perforation. Right TM visualized without erythema, bulging, effusion, perforation.      Nose: No congestion/rhinnorhea.      Mouth/Throat: Mucous membranes are moist. Pharynx without erythema, swelling, exudates. Neck: Upper with full range of motion Hematological/Lymphatic/Immunilogical: No cervical lymphadenopathy. Cardiovascular: Normal rate, regular rhythm. Normal S1 and S2.  Good peripheral circulation. Respiratory: Normal respiratory effort without tachypnea or retractions. Lungs CTAB with breath sounds noted in all lung fields. Neurologic:  Normal speech and language. No gross focal neurologic deficits are appreciated.  Skin:  Skin is warm, dry and intact. No rash noted. Psychiatric: Mood and affect are normal. Speech and behavior are normal. Patient exhibits appropriate insight and judgement.   ____________________________________________   LABS  None ____________________________________________  EKG  None ____________________________________________  RADIOLOGY  None ____________________________________________    PROCEDURES  Procedure(s) performed: None    Medications - No data to display   ____________________________________________   INITIAL IMPRESSION / ASSESSMENT AND PLAN / ED COURSE  Pertinent labs & imaging results that were available during my care of the patient were reviewed by me and considered  in my medical decision making (see chart for details).  Patient's diagnosis is consistent with acute left otitis media. Patient will be discharged home with prescriptions for amoxicillin and Flonase to take as directed. May take over-the-counter Tylenol or ibuprofen as needed for pain. Patient is to follow up with [facility] if symptoms persist past this treatment course. Patient is given ED precautions to return to the ED for any worsening or new symptoms.      ____________________________________________  FINAL CLINICAL IMPRESSION(S) / ED DIAGNOSES  Final diagnoses:  Acute left otitis media, recurrence not specified, unspecified otitis media type      NEW MEDICATIONS STARTED DURING THIS VISIT:  Discharge Medication List as of 07/30/2015 11:37 AM    START taking these medications   Details  amoxicillin (AMOXIL) 875 MG tablet Take 1 tablet (875 mg total) by mouth 2 (two) times daily., Starting Mon 07/30/2015, Print    fluticasone (FLONASE) 50 MCG/ACT nasal spray Place 2 sprays into both nostrils daily., Starting Mon 07/30/2015, Print             Ernestene Kiel [place], PA-C 07/30/15 1242    Phineas Semen, MD 07/30/15 1311

## 2015-10-08 ENCOUNTER — Emergency Department
Admission: EM | Admit: 2015-10-08 | Discharge: 2015-10-08 | Disposition: A | Discharge status: Home / Self Care | Payer: Self-pay | Attending: Emergency Medicine | Admitting: Emergency Medicine

## 2015-10-08 ENCOUNTER — Encounter: Payer: Self-pay | Admitting: Emergency Medicine

## 2015-10-08 DIAGNOSIS — Z79899 Other long term (current) drug therapy: Secondary | ICD-10-CM | POA: Insufficient documentation

## 2015-10-08 DIAGNOSIS — Z792 Long term (current) use of antibiotics: Secondary | ICD-10-CM | POA: Insufficient documentation

## 2015-10-08 DIAGNOSIS — F17210 Nicotine dependence, cigarettes, uncomplicated: Secondary | ICD-10-CM | POA: Insufficient documentation

## 2015-10-08 DIAGNOSIS — L02412 Cutaneous abscess of left axilla: Secondary | ICD-10-CM | POA: Insufficient documentation

## 2015-10-08 MED ORDER — SULFAMETHOXAZOLE-TRIMETHOPRIM 800-160 MG PO TABS: 1.0000 | ORAL_TABLET | Freq: Two times a day (BID) | ORAL | 0 refills | Status: DC

## 2015-10-08 MED ORDER — OXYCODONE-ACETAMINOPHEN 7.5-325 MG PO TABS: 1.0000 | ORAL_TABLET | ORAL | 0 refills | Status: DC | PRN

## 2015-10-08 NOTE — ED Triage Notes (Signed)
Pt with possible abscess to left axilla.

## 2015-10-08 NOTE — ED Notes (Signed)
Possible abscess area noted under left arm

## 2015-10-08 NOTE — ED Provider Notes (Signed)
[facility] Emergency Department Provider Note   ____________________________________________   First MD Initiated Contact with Patient 10/08/15 1041     (approximate)  I have reviewed the triage vital signs and the nursing notes.   HISTORY  Chief Complaint Abscess    HPI Tony Conley is a 30 y.o. male patient complaining of pain secondary to a nodule lesion left axillary area for 3 days. Patient denies any drainage from the area. Patient rates the pain as 8/10. Patient described a pain as "sharp". No palliative measures taken for this complaint.   History reviewed. No pertinent past medical history.  There are no active problems to display for this patient.   Past Surgical History:  Procedure Laterality Date  . TONSILLECTOMY    . tubes in ears      Prior to Admission medications   Medication Sig Start Date End Date Taking? Authorizing Provider  amoxicillin (AMOXIL) 875 MG tablet Take 1 tablet (875 mg total) by mouth 2 (two) times daily. 07/30/15   Jami L Hagler, PA-C  fluticasone (FLONASE) 50 MCG/ACT nasal spray Place 2 sprays into both nostrils daily. 07/30/15   Jami L Hagler, PA-C  ibuprofen (ADVIL,MOTRIN) 800 MG tablet Take 1 tablet (800 mg total) by mouth every 8 (eight) hours as needed. 06/11/15   Evangeline Dakin, PA-C  oxyCODONE-acetaminophen (PERCOCET) 7.5-325 MG tablet Take 1 tablet by mouth every 4 (four) hours as needed for severe pain. 10/08/15 10/07/16  Joni Reining, PA-C  sulfamethoxazole-trimethoprim (BACTRIM DS,SEPTRA DS) 800-160 MG tablet Take 1 tablet by mouth 2 (two) times daily. 10/08/15   Joni Reining, PA-C    Allergies Review of patient's allergies indicates no known allergies.  No family history on file.  Social History Social History  Substance Use Topics  . Smoking status: Current Every Day Smoker    Packs/day: 0.50    Types: Cigarettes  . Smokeless tobacco: Never Used  . Alcohol use No    Review of  Systems Constitutional: No fever/chills Eyes: No visual changes. ENT: No sore throat. Cardiovascular: Denies chest pain. Respiratory: Denies shortness of breath. Gastrointestinal: No abdominal pain.  No nausea, no vomiting.  No diarrhea.  No constipation. Genitourinary: Negative for dysuria. Musculoskeletal: Negative for back pain. Skin: Negative for rash.Nodule lesion left axillary area Neurological: Negative for headaches, focal weakness or numbness.    ____________________________________________   PHYSICAL EXAM:  VITAL SIGNS: ED Triage Vitals [10/08/15 1037]  Enc Vitals Group     BP      Pulse      Resp      Temp      Temp src      SpO2      Weight 145 lb (65.8 kg)     Height      Head Circumference      Peak Flow      Pain Score      Pain Loc      Pain Edu?      Excl. in GC?     Constitutional: Alert and oriented. Well appearing and in no acute distress. Eyes: Conjunctivae are normal. PERRL. EOMI. Head: Atraumatic. Nose: No congestion/rhinnorhea. Mouth/Throat: Mucous membranes are moist.  Oropharynx non-erythematous. Neck: No stridor.  No cervical spine tenderness to palpation. Hematological/Lymphatic/Immunilogical: No cervical lymphadenopathy. Cardiovascular: Normal rate, regular rhythm. Grossly normal heart sounds.  Good peripheral circulation. Respiratory: Normal respiratory effort.  No retractions. Lungs CTAB. Gastrointestinal: Soft and nontender. No distention. No abdominal bruits. No CVA  tenderness. Musculoskeletal: No lower extremity tenderness nor edema.  No joint effusions. Neurologic:  Normal speech and language. No gross focal neurologic deficits are appreciated. No gait instability. Skin:  Skin is warm, dry and intact. Nonfluctuant nodule lesion left axillary area. Mild guarding with palpation. No discharge. Psychiatric: Mood and affect are normal. Speech and behavior are normal.  ____________________________________________   LABS (all labs  ordered are listed, but only abnormal results are displayed)  Labs Reviewed - No data to display ____________________________________________  EKG   ____________________________________________  RADIOLOGY   ____________________________________________   PROCEDURES  Procedure(s) performed: None  Procedures  Critical Care performed: No  ____________________________________________   INITIAL IMPRESSION / ASSESSMENT AND PLAN / ED COURSE  Pertinent labs & imaging results that were available during my care of the patient were reviewed by me and considered in my medical decision making (see chart for details).  Left axillary abscess. Discussed with patient rationale for not I/D lesion at this time. Patient given discharge care instructions. Patient given a prescription for Bactrim DS and Percocets. Patient given a work note for 2 days. Patient advised to follow-up in 3-5 days if no improvement or worsening of condition.  Clinical Course     ____________________________________________   FINAL CLINICAL IMPRESSION(S) / ED DIAGNOSES  Final diagnoses:  Abscess of left axilla      NEW MEDICATIONS STARTED DURING THIS VISIT:  New Prescriptions   OXYCODONE-ACETAMINOPHEN (PERCOCET) 7.5-325 MG TABLET    Take 1 tablet by mouth every 4 (four) hours as needed for severe pain.   SULFAMETHOXAZOLE-TRIMETHOPRIM (BACTRIM DS,SEPTRA DS) 800-160 MG TABLET    Take 1 tablet by mouth 2 (two) times daily.     Note:  This document was prepared using Dragon voice recognition software and may include unintentional dictation errors.    Joni ReiningRonald K Smith, PA-C 10/08/15 1052    Nita Sicklearolina Veronese, MD 10/09/15 [phone]

## 2015-10-08 NOTE — Discharge Instructions (Signed)
Apply warm compresses 5-10 minutes to the area twice a day.

## 2016-03-24 ENCOUNTER — Encounter: Payer: Self-pay | Admitting: Emergency Medicine

## 2016-03-24 ENCOUNTER — Emergency Department
Admission: EM | Admit: 2016-03-24 | Discharge: 2016-03-24 | Disposition: A | Discharge status: Home / Self Care | Payer: 59 | Attending: Emergency Medicine | Admitting: Emergency Medicine

## 2016-03-24 DIAGNOSIS — K648 Other hemorrhoids: Secondary | ICD-10-CM | POA: Insufficient documentation

## 2016-03-24 DIAGNOSIS — F17210 Nicotine dependence, cigarettes, uncomplicated: Secondary | ICD-10-CM | POA: Insufficient documentation

## 2016-03-24 DIAGNOSIS — K6289 Other specified diseases of anus and rectum: Secondary | ICD-10-CM

## 2016-03-24 DIAGNOSIS — K649 Unspecified hemorrhoids: Secondary | ICD-10-CM

## 2016-03-24 MED ORDER — DIBUCAINE 1 % RE OINT: 1.0000 "application " | TOPICAL_OINTMENT | Freq: Three times a day (TID) | RECTAL | 0 refills | Status: DC | PRN

## 2016-03-24 MED ORDER — HYDROCORTISONE ACETATE 25 MG RE SUPP: 25.0000 mg | Freq: Two times a day (BID) | RECTAL | 1 refills | Status: DC

## 2016-03-24 NOTE — ED Triage Notes (Signed)
Pt reports rash to groin area. Pt states rash initially itched and burned but that stopped. Denies pain at site. Denies painful urination or penile discharge.

## 2016-03-24 NOTE — ED Provider Notes (Signed)
[facility] Emergency Department Provider Note ____________________________________________  Time seen: 1144  I have reviewed the triage vital signs and the nursing notes.  HISTORY  Chief Complaint  Rectal Pain  HPI Tony Conley is a 31 y.o. male presents to the ED for evaluation of rectal pain.He reports some discomfort from las week. He reports some increased pain today. He describes pain with sitting. He is also aware of some BRBTP following firm bowel movements. He denies fevers, chills, sweats, or nausea. He denies any direct rectal trauma.   History reviewed. No pertinent past medical history.  There are no active problems to display for this patient.  Past Surgical History:  Procedure Laterality Date  . TONSILLECTOMY    . tubes in ears      Prior to Admission medications   Medication Sig Start Date End Date Taking? Authorizing Provider  amoxicillin (AMOXIL) 875 MG tablet Take 1 tablet (875 mg total) by mouth 2 (two) times daily. 07/30/15   Jami L Hagler, PA-C  dibucaine (NUPERCAINAL) 1 % OINT Place 1 application rectally 3 (three) times daily as needed for pain. 03/24/16   Chalsey Leeth V Bacon Mika Griffitts, PA-C  fluticasone (FLONASE) 50 MCG/ACT nasal spray Place 2 sprays into both nostrils daily. 07/30/15   Jami L Hagler, PA-C  hydrocortisone (ANUSOL-HC) 25 MG suppository Place 1 suppository (25 mg total) rectally every 12 (twelve) hours. 03/24/16 04/05/16  Savanna Dooley V Bacon Karan Inclan, PA-C  ibuprofen (ADVIL,MOTRIN) 800 MG tablet Take 1 tablet (800 mg total) by mouth every 8 (eight) hours as needed. 06/11/15   Evangeline Dakin, PA-C  oxyCODONE-acetaminophen (PERCOCET) 7.5-325 MG tablet Take 1 tablet by mouth every 4 (four) hours as needed for severe pain. 10/08/15 10/07/16  Joni Reining, PA-C  sulfamethoxazole-trimethoprim (BACTRIM DS,SEPTRA DS) 800-160 MG tablet Take 1 tablet by mouth 2 (two) times daily. 10/08/15   Joni Reining, PA-C    Allergies Patient has no  known allergies.  No family history on file.  Social History Social History  Substance Use Topics  . Smoking status: Current Every Day Smoker    Packs/day: 0.50    Types: Cigarettes  . Smokeless tobacco: Never Used  . Alcohol use No    Review of Systems  Constitutional: Negative for fever. Cardiovascular: Negative for chest pain. Respiratory: Negative for shortness of breath. Gastrointestinal: Negative for abdominal pain, vomiting and diarrhea. Rectal pain as above.  Genitourinary: Negative for dysuria. Musculoskeletal: Negative for back pain. Skin: Negative for rash. Neurological: Negative for headaches, focal weakness or numbness. ____________________________________________  PHYSICAL EXAM:  VITAL SIGNS: ED Triage Vitals [03/24/16 1137]  Enc Vitals Group     BP (!) 145/94     Pulse Rate 88     Resp 18     Temp 97.7 F (36.5 C)     Temp Source Oral     SpO2 98 %     Weight 140 lb (63.5 kg)     Height 5\' 9"  (1.753 m)     Head Circumference      Peak Flow      Pain Score      Pain Loc      Pain Edu?      Excl. in GC?     Constitutional: Alert and oriented. Well appearing and in no distress. Head: Normocephalic and atraumatic. Cardiovascular: Normal rate, regular rhythm. Normal distal pulses. Respiratory: Normal respiratory effort. No wheezes/rales/rhonchi. Gastrointestinal: Soft and nontender. No distention. Rectum without edema, redness, or obvious external  hemorrhoids. Small skin tag noted at 9 o'clock on the rectum. Small fissure noted in the same area. Normal rectal ton. Tenderness to palp at 12 o'clock on DRE.  Musculoskeletal: Nontender with normal range of motion in all extremities.  Neurologic:  Normal gait without ataxia. Normal speech and language. No gross focal neurologic deficits are appreciated. Skin:  Skin is warm, dry and intact. No rash noted. ____________________________________________  INITIAL IMPRESSION / ASSESSMENT AND PLAN / ED  COURSE  Patient who presents to the ED for evaluation rectal pain and mild rectal bleeding. His exam and symptoms likely represent pain due to anal fissures and internal hemorrhoids. He is discharged with prescriptions for dibucaine and hydrocortisone suppositories. He will increase dietary fiber and use daily stool softeners. [facility]  ____________________________________________  FINAL CLINICAL IMPRESSION(S) / ED DIAGNOSES  Final diagnoses:  Rectal pain  Hemorrhoids, unspecified hemorrhoid type     Lissa HoardJenise V Bacon Dequavious Harshberger, PA-C 03/24/16 1236    Jene Everyobert Kinner, MD 03/24/16 1526

## 2016-03-24 NOTE — Discharge Instructions (Signed)
You may be experiencing pain in the rectum from hemorrhoids and a small, superficial tear near the rectum. Use the suppository and cream as needed. Consider starting a daily stool softener (Colace/Miralax) to prevent constipation. Increase the fibber naturally in your diet as well. [facility].

## 2016-03-31 ENCOUNTER — Emergency Department
Admission: EM | Admit: 2016-03-31 | Discharge: 2016-03-31 | Disposition: A | Discharge status: Home / Self Care | Payer: 59 | Attending: Emergency Medicine | Admitting: Emergency Medicine

## 2016-03-31 ENCOUNTER — Encounter: Payer: Self-pay | Admitting: *Deleted

## 2016-03-31 DIAGNOSIS — B349 Viral infection, unspecified: Secondary | ICD-10-CM | POA: Diagnosis not present

## 2016-03-31 DIAGNOSIS — F17210 Nicotine dependence, cigarettes, uncomplicated: Secondary | ICD-10-CM | POA: Insufficient documentation

## 2016-03-31 DIAGNOSIS — H9201 Otalgia, right ear: Secondary | ICD-10-CM | POA: Diagnosis present

## 2016-03-31 MED ORDER — FLUTICASONE PROPIONATE 50 MCG/ACT NA SUSP: 2.0000 | Freq: Every day | NASAL | 0 refills | Status: AC

## 2016-03-31 NOTE — ED Provider Notes (Signed)
[facility] Emergency Department Provider Note  ____________________________________________  Time seen: Approximately 1:37 PM  I have reviewed the triage vital signs and the nursing notes.   HISTORY  Chief Complaint Otalgia    HPI Tony Conley is a 31 y.o. male that presents to the emergency department with right ear pain last night. Patient states that he could feel his ear popping last night. He is currently concerned for an ear infection. Patient states that it felt like when you change elevation and your ears pop. No ear pain today. Patient states that he has also had some congestion. He needs a work note because he missed work to come to the ER. Patient denies fever, cough, shortness of breath, chest pain, nausea, vomiting, abdominal pain.   History reviewed. No pertinent past medical history.  There are no active problems to display for this patient.   Past Surgical History:  Procedure Laterality Date  . TONSILLECTOMY    . tubes in ears      Prior to Admission medications   Medication Sig Start Date End Date Taking? Authorizing Provider  dibucaine (NUPERCAINAL) 1 % OINT Place 1 application rectally 3 (three) times daily as needed for pain. 03/24/16   Jenise V Bacon Menshew, PA-C  fluticasone (FLONASE) 50 MCG/ACT nasal spray Place 2 sprays into both nostrils daily. 03/31/16 03/31/17  Enid Derry, PA-C    Allergies Patient has no known allergies.  History reviewed. No pertinent family history.  Social History Social History  Substance Use Topics  . Smoking status: Current Every Day Smoker    Packs/day: 0.50    Types: Cigarettes  . Smokeless tobacco: Never Used  . Alcohol use No     Review of Systems  Constitutional: No fever/chills Cardiovascular: No chest pain. Respiratory: No cough. No SOB. Gastrointestinal: No abdominal pain.  No nausea, no vomiting.  Skin: Negative for rash, abrasions, lacerations, ecchymosis. Neurological:  Negative for headaches, numbness or tingling   ____________________________________________   PHYSICAL EXAM:  VITAL SIGNS: ED Triage Vitals [03/31/16 1255]  Enc Vitals Group     BP (!) 145/92     Pulse Rate 92     Resp 18     Temp 98.6 F (37 C)     Temp Source Oral     SpO2 98 %     Weight 145 lb (65.8 kg)     Height 5\' 9"  (1.753 m)     Head Circumference      Peak Flow      Pain Score 7     Pain Loc      Pain Edu?      Excl. in GC?      Constitutional: Alert and oriented. Well appearing and in no acute distress. Eyes: Conjunctivae are normal. PERRL. EOMI. Head: Atraumatic. ENT:      Ears: Tympanic membranes pearly gray with good landmarks.      Nose: No congestion/rhinnorhea.      Mouth/Throat: Mucous membranes are moist.  Neck: No stridor.   Cardiovascular: Normal rate, regular rhythm.  Good peripheral circulation. Respiratory: Normal respiratory effort without tachypnea or retractions. Lungs CTAB. Good air entry to the bases with no decreased or absent breath sounds. Musculoskeletal: Full range of motion to all extremities. No gross deformities appreciated. Neurologic:  Normal speech and language. No gross focal neurologic deficits are appreciated.  Skin:  Skin is warm, dry and intact. No rash noted.   ____________________________________________   LABS (all labs ordered are listed,  but only abnormal results are displayed)  Labs Reviewed - No data to display ____________________________________________  EKG   ____________________________________________  RADIOLOGY  No results found.  ____________________________________________    PROCEDURES  Procedure(s) performed:    Procedures    Medications - No data to display   ____________________________________________   INITIAL IMPRESSION / ASSESSMENT AND PLAN / ED COURSE  Pertinent labs & imaging results that were available during my care of the patient were reviewed by me and considered  in my medical decision making (see chart for details).  Review of the [place] CSRS was performed in accordance of the [facility] prior to dispensing any controlled drugs.   Patient's diagnosis is consistent with viral illness. Vital signs and exam are reassuring. Patient states that he has some congestion today and no ear pain today. Education about eustachian tube dysfunction was provided. Patient will be discharged home with prescriptions for Flonase. Work note was provided. Patient is to follow up with PCP as directed. Patient is given ED precautions to return to the ED for any worsening or new symptoms.     ____________________________________________  FINAL CLINICAL IMPRESSION(S) / ED DIAGNOSES  Final diagnoses:  Viral illness      NEW MEDICATIONS STARTED DURING THIS VISIT:  Discharge Medication List as of 03/31/2016  1:42 PM          This chart was dictated using voice recognition software/Dragon. Despite best efforts to proofread, errors can occur which can change the meaning. Any change was purely unintentional.    Enid DerryAshley Tomio Kirk, PA-C 03/31/16 1439    Emily FilbertJonathan E Williams, MD 03/31/16 [phone]

## 2016-03-31 NOTE — ED Notes (Signed)
See triage note  Developed right ear pain last pm  Denies any fever or trauma

## 2016-03-31 NOTE — ED Triage Notes (Signed)
Pt states right ear pain since last night. 

## 2017-02-21 ENCOUNTER — Encounter: Payer: Self-pay | Admitting: Emergency Medicine

## 2017-02-21 ENCOUNTER — Emergency Department
Admission: EM | Admit: 2017-02-21 | Discharge: 2017-02-21 | Disposition: A | Discharge status: Home / Self Care | Payer: 59 | Attending: Emergency Medicine | Admitting: Emergency Medicine

## 2017-02-21 ENCOUNTER — Other Ambulatory Visit: Payer: Self-pay

## 2017-02-21 DIAGNOSIS — Z79899 Other long term (current) drug therapy: Secondary | ICD-10-CM | POA: Insufficient documentation

## 2017-02-21 DIAGNOSIS — B359 Dermatophytosis, unspecified: Secondary | ICD-10-CM

## 2017-02-21 DIAGNOSIS — F17210 Nicotine dependence, cigarettes, uncomplicated: Secondary | ICD-10-CM | POA: Insufficient documentation

## 2017-02-21 DIAGNOSIS — B354 Tinea corporis: Secondary | ICD-10-CM | POA: Insufficient documentation

## 2017-02-21 MED ORDER — HYDROCORTISONE 0.5 % EX CREA: 1.0000 "application " | TOPICAL_CREAM | Freq: Two times a day (BID) | CUTANEOUS | 0 refills | Status: DC

## 2017-02-21 MED ORDER — FLUCONAZOLE 100 MG PO TABS: ORAL_TABLET | ORAL | 0 refills | Status: DC

## 2017-02-21 MED ORDER — KETOCONAZOLE 2 % EX CREA: 1.0000 "application " | TOPICAL_CREAM | Freq: Every day | CUTANEOUS | 0 refills | Status: DC

## 2017-02-21 MED ORDER — KETOCONAZOLE 2 % EX SHAM: 1.0000 "application " | MEDICATED_SHAMPOO | CUTANEOUS | 0 refills | Status: DC

## 2017-02-21 NOTE — ED Provider Notes (Signed)
[facility] Emergency Department Provider Note  ____________________________________________  Time seen: Approximately 2:12 PM  I have reviewed the triage vital signs and the nursing notes.   HISTORY  Chief Complaint Rash    HPI Tony Conley is a 32 y.o. male that presents emergency department for evaluation of rash to face and neck for for 1 month.  Rash itches.  He has been applying Neosporin without relief.  He recently switched soap.  No one in the house has similar rash.  This is never happened before.  No fever, shortness of breath, chest pain, nausea, vomiting.  History reviewed. No pertinent past medical history.  There are no active problems to display for this patient.   Past Surgical History:  Procedure Laterality Date  . TONSILLECTOMY    . tubes in ears      Prior to Admission medications   Medication Sig Start Date End Date Taking? Authorizing Provider  dibucaine (NUPERCAINAL) 1 % OINT Place 1 application rectally 3 (three) times daily as needed for pain. 03/24/16   Menshew, Charlesetta IvoryJenise V Bacon, PA-C  fluconazole (DIFLUCAN) 100 MG tablet Take 2 tablets, once weekly for 3 weeks 02/21/17   Enid DerryWagner, Sher Hellinger, PA-C  fluticasone [facility](FLONASE) 50 MCG/ACT nasal spray Place 2 sprays into both nostrils daily. 03/31/16 03/31/17  Enid DerryWagner, Ceana Fiala, PA-C  hydrocortisone cream 0.5 % Apply 1 application topically 2 (two) times daily. 02/21/17   Enid DerryWagner, Deltha Bernales, PA-C  ketoconazole (NIZORAL) 2 % cream Apply 1 application topically daily. 02/21/17   Enid DerryWagner, Marnisha Stampley, PA-C  ketoconazole (NIZORAL) 2 % shampoo Apply 1 application topically 2 (two) times a week. 02/23/17   Enid DerryWagner, Beckie Viscardi, PA-C    Allergies Patient has no known allergies.  No family history on file.  Social History Social History   Tobacco Use  . Smoking status: Current Every Day Smoker    Packs/day: 0.50    Types: Cigarettes  . Smokeless tobacco: Never Used  Substance Use Topics  . Alcohol use: No  .  Drug use: No     Review of Systems  Constitutional: No fever/chills Cardiovascular: No chest pain. Respiratory: No SOB. Gastrointestinal: No abdominal pain.  No nausea, no vomiting.  Musculoskeletal: Negative for musculoskeletal pain. Skin: Negative for abrasions, lacerations, ecchymosis.  Positive for rash. Neurological: Negative for headaches  ____________________________________________   PHYSICAL EXAM:  VITAL SIGNS: ED Triage Vitals  Enc Vitals Group     BP 02/21/17 1210 (!) 144/93     Pulse Rate 02/21/17 1210 72     Resp 02/21/17 1210 18     Temp 02/21/17 1210 98 F (36.7 C)     Temp Source 02/21/17 1210 Oral     SpO2 02/21/17 1210 98 %     Weight 02/21/17 1211 140 lb (63.5 kg)     Height 02/21/17 1211 5\' 8"  (1.727 m)     Head Circumference --      Peak Flow --      Pain Score --      Pain Loc --      Pain Edu? --      Excl. in GC? --      Constitutional: Alert and oriented. Well appearing and in no acute distress. Eyes: Conjunctivae are normal. PERRL. EOMI. Head: Atraumatic. ENT:      Ears:      Nose: No congestion/rhinnorhea.      Mouth/Throat: Mucous membranes are moist.  Neck: No stridor.   Cardiovascular: Normal rate, regular rhythm.  Good peripheral circulation.  Respiratory: Normal respiratory effort without tachypnea or retractions. Lungs CTAB. Good air entry to the bases with no decreased or absent breath sounds. Musculoskeletal: Full range of motion to all extremities. No gross deformities appreciated. Neurologic:  Normal speech and language. No gross focal neurologic deficits are appreciated.  Skin:  Skin is warm, dry and intact.  2, 1 cm x 1 cm  scaling plaques to posterior neck.  1/2 cm x 1/2 cm scaling plaques behind ears bilaterally.  Flaking to scalp, beard.  No drainage.  No erythema.    ____________________________________________   LABS (all labs ordered are listed, but only abnormal results are displayed)  Labs Reviewed - No data to  display ____________________________________________  EKG   ____________________________________________  RADIOLOGY   No results found.  ____________________________________________    PROCEDURES  Procedure(s) performed:    Procedures    Medications - No data to display   ____________________________________________   INITIAL IMPRESSION / ASSESSMENT AND PLAN / ED COURSE  Pertinent labs & imaging results that were available during my care of the patient were reviewed by me and considered in my medical decision making (see chart for details).  Review of the [place] CSRS was performed in accordance of the [facility] prior to dispensing any controlled drugs.  Patient presents to the emergency department for evaluation of rash to neck for 1 month.  Symptoms are consistent with tinea infection.  Patient will be discharged home with prescriptions for oral fluconazole, ketoconazole and hydrocortisone cream for plaques and ketoconazole shampoo for beard and hair.  Patient will follow up with dermatology this week. Patient is given ED precautions to return to the ED for any worsening or new symptoms.     ____________________________________________  FINAL CLINICAL IMPRESSION(S) / ED DIAGNOSES  Final diagnoses:  Tinea      NEW MEDICATIONS STARTED DURING THIS VISIT:  ED Discharge Orders        Ordered    ketoconazole (NIZORAL) 2 % shampoo  2 times weekly     02/21/17 1412    fluconazole (DIFLUCAN) 100 MG tablet     02/21/17 1342    ketoconazole (NIZORAL) 2 % cream  Daily     02/21/17 1342    hydrocortisone cream 0.5 %  2 times daily     02/21/17 1342          This chart was dictated using voice recognition software/Dragon. Despite best efforts to proofread, errors can occur which can change the meaning. Any change was purely unintentional.    Enid Derry, PA-C 02/21/17 1528    Arnaldo Natal, MD 02/22/17 [phone]

## 2017-02-21 NOTE — ED Notes (Signed)
Pt reports that he has been having a "break" out on his face, back of his neck and eyebrows.  Pt states that he used some OTC creams on them, but that it has gotten worse.  Pt denies seeing PCP regarding this issue.  Pt is A&Ox4, in NAD.  Ambulatory from triage.

## 2017-02-21 NOTE — ED Triage Notes (Signed)
Rash neck and behind ears x 1 month. Itchy at night.

## 2017-03-11 ENCOUNTER — Encounter: Payer: Self-pay | Admitting: Emergency Medicine

## 2017-03-11 ENCOUNTER — Emergency Department
Admission: EM | Admit: 2017-03-11 | Discharge: 2017-03-11 | Disposition: A | Discharge status: Home / Self Care | Payer: Self-pay | Attending: Emergency Medicine | Admitting: Emergency Medicine

## 2017-03-11 ENCOUNTER — Other Ambulatory Visit: Payer: Self-pay

## 2017-03-11 DIAGNOSIS — Z79899 Other long term (current) drug therapy: Secondary | ICD-10-CM | POA: Insufficient documentation

## 2017-03-11 DIAGNOSIS — B0230 Zoster ocular disease, unspecified: Secondary | ICD-10-CM | POA: Insufficient documentation

## 2017-03-11 DIAGNOSIS — F17210 Nicotine dependence, cigarettes, uncomplicated: Secondary | ICD-10-CM | POA: Insufficient documentation

## 2017-03-11 MED ORDER — VALACYCLOVIR HCL 1 G PO TABS: 1000.0000 mg | ORAL_TABLET | Freq: Three times a day (TID) | ORAL | 0 refills | Status: DC

## 2017-03-11 MED ORDER — FLUORESCEIN SODIUM 1 MG OP STRP: 1.0000 | ORAL_STRIP | Freq: Once | OPHTHALMIC | Status: DC

## 2017-03-11 MED ORDER — TRAMADOL HCL 50 MG PO TABS: 50.0000 mg | ORAL_TABLET | Freq: Four times a day (QID) | ORAL | 0 refills | Status: DC | PRN

## 2017-03-11 MED ORDER — FLUORESCEIN SODIUM 1 MG OP STRP
ORAL_STRIP | OPHTHALMIC | Status: AC
  Filled 2017-03-11: qty 1

## 2017-03-11 NOTE — ED Triage Notes (Signed)
Swelling and pain to left eye that began yesterday, denies any itching, watery upon waking this am.

## 2017-03-11 NOTE — ED Provider Notes (Signed)
[facility] Emergency Department Provider Note  ____________________________________________  Time seen: Approximately 10:47  I have reviewed the triage vital signs and the nursing notes.   HISTORY  Chief Complaint Facial Swelling   HPI Tony Conley is a 32 y.o. male who presents to the emergency department for evaluation of left eye irritation, photophobia, and rash.  Symptoms started 2 days ago.  He states that the vision in the left eye is slightly blurred.  He states that approximately 2 weeks ago he had some type of URI/sinus infection but states that that resolved without intervention.  The skin around the eye is erythematous and painful.  He has also noticed some type of rash on the left side of his head that is now moving toward his eye. History reviewed. No pertinent past medical history.  There are no active problems to display for this patient.   Past Surgical History:  Procedure Laterality Date  . TONSILLECTOMY    . tubes in ears      Prior to Admission medications   Medication Sig Start Date End Date Taking? Authorizing Provider  dibucaine (NUPERCAINAL) 1 % OINT Place 1 application rectally 3 (three) times daily as needed for pain. 03/24/16   Menshew, Charlesetta IvoryJenise V Bacon, PA-C  fluconazole (DIFLUCAN) 100 MG tablet Take 2 tablets, once weekly for 3 weeks 02/21/17   Enid DerryWagner, Ashley, PA-C  fluticasone [facility](FLONASE) 50 MCG/ACT nasal spray Place 2 sprays into both nostrils daily. 03/31/16 03/31/17  Enid DerryWagner, Ashley, PA-C  hydrocortisone cream 0.5 % Apply 1 application topically 2 (two) times daily. 02/21/17   Enid DerryWagner, Ashley, PA-C  ketoconazole (NIZORAL) 2 % cream Apply 1 application topically daily. 02/21/17   Enid DerryWagner, Ashley, PA-C  ketoconazole (NIZORAL) 2 % shampoo Apply 1 application topically 2 (two) times a week. 02/23/17   Enid DerryWagner, Ashley, PA-C  traMADol (ULTRAM) 50 MG tablet Take 1 tablet (50 mg total) by mouth every 6 (six) hours as needed. 03/11/17   Monda Chastain,  Rulon Eisenmengerari B, FNP  valACYclovir (VALTREX) 1000 MG tablet Take 1 tablet (1,000 mg total) by mouth 3 (three) times daily. 03/11/17   Chinita Pesterriplett, Nissim Fleischer B, FNP    Allergies Patient has no known allergies.  No family history on file.  Social History Social History   Tobacco Use  . Smoking status: Current Every Day Smoker    Packs/day: 0.50    Types: Cigarettes  . Smokeless tobacco: Never Used  Substance Use Topics  . Alcohol use: No  . Drug use: No    Review of Systems  Constitutional: Negative for fever. Respiratory: Negative for cough or shortness of breath.  Musculoskeletal: Negative for myalgias Skin: Positive for rash Neurological: Positive for numbness or paresthesias. ____________________________________________   PHYSICAL EXAM:  VITAL SIGNS: ED Triage Vitals  Enc Vitals Group     BP 03/11/17 1014 (!) 141/91     Pulse Rate 03/11/17 1014 69     Resp 03/11/17 1014 16     Temp 03/11/17 1014 98.4 F (36.9 C)     Temp Source 03/11/17 1014 Oral     SpO2 03/11/17 1014 97 %     Weight 03/11/17 1014 140 lb (63.5 kg)     Height 03/11/17 1014 5\' 8"  (1.727 m)     Head Circumference --      Peak Flow --      Pain Score 03/11/17 1024 9     Pain Loc --      Pain Edu? --  Excl. in GC? --      Constitutional: Acutely ill appearing. Eyes: Conjunctivae are clear without discharge or drainage.  No corneal involvement on fluorescein stain exam. Nose: No rhinorrhea noted. Mouth/Throat: Airway is patent.  Neck: No stridor. Unrestricted range of motion observed. Lymphatic: No lymphadenopathy noted Cardiovascular: Capillary refill is <3 seconds.  Respiratory: Respirations are even and unlabored.. Musculoskeletal: Unrestricted range of motion observed. Neurologic: Awake, alert, and oriented x 4.  Skin: Vesicular, grouped lesions noted on the left eyelid, left eyebrow, and left temple.  No lesions noted on the nose.  ____________________________________________   LABS (all labs  ordered are listed, but only abnormal results are displayed)  Labs Reviewed - No data to display ____________________________________________  EKG  Not indicated not indicated ____________________________________________  RADIOLOGY  Not indicated ____________________________________________   PROCEDURES  Procedures ____________________________________________   INITIAL IMPRESSION / ASSESSMENT AND PLAN / ED COURSE  Tony Conley is a 32 y.o. male who presents to the emergency department for treatment and evaluation most consistent with herpes zoster with possible ophthalmic complication.  Case was discussed with Dr. Brooke Dare who is the ophthalmologist on-call.  I will start the patient on valacyclovir today and have him see Dr. Brooke Dare tomorrow afternoon at 330.  Patient was made aware of this appointment and agrees to be seen.  He was given strict ER return precautions as well.  A work note has been provided for the next 2 days.  Medications - No data to display   Pertinent labs & imaging results that were available during my care of the patient were reviewed by me and considered in my medical decision making (see chart for details). ____________________________________________   FINAL CLINICAL IMPRESSION(S) / ED DIAGNOSES  Final diagnoses:  Herpes zoster with ophthalmic complication, unspecified herpes zoster eye disease    ED Discharge Orders        Ordered    valACYclovir (VALTREX) 1000 MG tablet  3 times daily     03/11/17 1102    traMADol (ULTRAM) 50 MG tablet  Every 6 hours PRN     03/11/17 1102       Note:  This document was prepared using Dragon voice recognition software and may include unintentional dictation errors.    Chinita Pester, FNP 03/11/17 1450    Sharman Cheek, MD 03/11/17 [phone]

## 2017-03-11 NOTE — Discharge Instructions (Signed)
Please arrive at 3:15 for your appointment.

## 2017-03-16 ENCOUNTER — Telehealth: Payer: Self-pay | Admitting: Infectious Disease

## 2017-03-16 ENCOUNTER — Other Ambulatory Visit
Admission: RE | Admit: 2017-03-16 | Discharge: 2017-03-16 | Disposition: A | Discharge status: Home / Self Care | Payer: Self-pay | Source: Ambulatory Visit | Attending: Ophthalmology | Admitting: Ophthalmology

## 2017-03-16 DIAGNOSIS — B0230 Zoster ocular disease, unspecified: Secondary | ICD-10-CM | POA: Insufficient documentation

## 2017-03-16 LAB — RAPID HIV SCREEN (HIV 1/2 AB+AG)
HIV 1/2 ANTIBODIES: REACTIVE — AB
HIV-1 P24 ANTIGEN - HIV24: NONREACTIVE

## 2017-03-16 NOTE — Telephone Encounter (Signed)
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 Dr. Sampson GoonFitzgerald or us to repeat the test. I noticed he has been on fluconazole which would not be typical for a healthy 32 year old to be on it chronically

## 2017-03-16 NOTE — Telephone Encounter (Signed)
Will follow up 

## 2017-03-16 NOTE — Telephone Encounter (Signed)
Dr. Sampson GoonFitzgerald is going to bring him into his office.

## 2017-03-26 DIAGNOSIS — Z21 Asymptomatic human immunodeficiency virus [HIV] infection status: Secondary | ICD-10-CM | POA: Insufficient documentation

## 2017-04-07 LAB — MISC LABCORP TEST (SEND OUT): LABCORP TEST CODE: 550630

## 2017-05-01 ENCOUNTER — Other Ambulatory Visit: Payer: Self-pay

## 2017-05-01 ENCOUNTER — Encounter: Payer: Self-pay | Admitting: Emergency Medicine

## 2017-05-01 ENCOUNTER — Emergency Department
Admission: EM | Admit: 2017-05-01 | Discharge: 2017-05-01 | Disposition: A | Discharge status: Home / Self Care | Payer: Self-pay | Attending: Emergency Medicine | Admitting: Emergency Medicine

## 2017-05-01 DIAGNOSIS — F17210 Nicotine dependence, cigarettes, uncomplicated: Secondary | ICD-10-CM | POA: Insufficient documentation

## 2017-05-01 DIAGNOSIS — Z79899 Other long term (current) drug therapy: Secondary | ICD-10-CM | POA: Insufficient documentation

## 2017-05-01 DIAGNOSIS — H1032 Unspecified acute conjunctivitis, left eye: Secondary | ICD-10-CM | POA: Insufficient documentation

## 2017-05-01 MED ORDER — NAPHAZOLINE HCL 0.1 % OP SOLN: 1.0000 [drp] | Freq: Four times a day (QID) | OPHTHALMIC | 0 refills | Status: DC | PRN

## 2017-05-01 MED ORDER — GENTAMICIN SULFATE 0.3 % OP SOLN: 1.0000 [drp] | Freq: Four times a day (QID) | OPHTHALMIC | 0 refills | Status: DC

## 2017-05-01 NOTE — ED Provider Notes (Signed)
[facility] Emergency Department Provider Note   ____________________________________________   First MD Initiated Contact with Patient 05/01/17 1123     (approximate)  I have reviewed the triage vital signs and the nursing notes.   HISTORY  Chief Complaint Eye Problem    HPI Tony Conley is a 32 y.o. male patient complaining of left redness, yellow-green mucus drainage, and occasional itching.  Patient denies vision change.  Patient denies pain at this time.  No palliative measures for complaint.   History reviewed. No pertinent past medical history.  There are no active problems to display for this patient.   Past Surgical History:  Procedure Laterality Date  . TONSILLECTOMY    . tubes in ears      Prior to Admission medications   Medication Sig Start Date End Date Taking? Authorizing Provider  dibucaine (NUPERCAINAL) 1 % OINT Place 1 application rectally 3 (three) times daily as needed for pain. 03/24/16   Menshew, Charlesetta IvoryJenise V Bacon, PA-C  fluconazole (DIFLUCAN) 100 MG tablet Take 2 tablets, once weekly for 3 weeks 02/21/17   Enid DerryWagner, Ashley, PA-C  fluticasone [facility](FLONASE) 50 MCG/ACT nasal spray Place 2 sprays into both nostrils daily. 03/31/16 03/31/17  Enid DerryWagner, Ashley, PA-C  gentamicin (GARAMYCIN) 0.3 % ophthalmic solution Place 1 drop into both eyes 4 (four) times daily. 05/01/17   Joni ReiningSmith, Ronald K, PA-C  hydrocortisone cream 0.5 % Apply 1 application topically 2 (two) times daily. 02/21/17   Enid DerryWagner, Ashley, PA-C  ketoconazole (NIZORAL) 2 % cream Apply 1 application topically daily. 02/21/17   Enid DerryWagner, Ashley, PA-C  ketoconazole (NIZORAL) 2 % shampoo Apply 1 application topically 2 (two) times a week. 02/23/17   Enid DerryWagner, Ashley, PA-C  naphazoline (NAPHCON) 0.1 % ophthalmic solution Place 1 drop into both eyes 4 (four) times daily as needed for eye irritation. 05/01/17   Joni ReiningSmith, Ronald K, PA-C  traMADol (ULTRAM) 50 MG tablet Take 1 tablet (50 mg total) by mouth  every 6 (six) hours as needed. 03/11/17   Triplett, Rulon Eisenmengerari B, FNP  valACYclovir (VALTREX) 1000 MG tablet Take 1 tablet (1,000 mg total) by mouth 3 (three) times daily. 03/11/17   Chinita Pesterriplett, Cari B, FNP    Allergies Patient has no known allergies.  No family history on file.  Social History Social History   Tobacco Use  . Smoking status: Current Every Day Smoker    Packs/day: 0.50    Types: Cigarettes  . Smokeless tobacco: Never Used  Substance Use Topics  . Alcohol use: No  . Drug use: No    Review of Systems Constitutional: No fever/chills Eyes: No visual changes.  Left eye redness and drainage. ENT: No sore throat. Cardiovascular: Denies chest pain. Respiratory: Denies shortness of breath. Gastrointestinal: No abdominal pain.  No nausea, no vomiting.  No diarrhea.  No constipation. Genitourinary: Negative for dysuria. Musculoskeletal: Negative for back pain. Skin: Negative for rash. Neurological: Negative for headaches, focal weakness or numbness.   ____________________________________________   PHYSICAL EXAM:  VITAL SIGNS: ED Triage Vitals  Enc Vitals Group     BP 05/01/17 1113 118/66     Pulse Rate 05/01/17 1113 99     Resp 05/01/17 1113 20     Temp 05/01/17 1113 99.5 F (37.5 C)     Temp Source 05/01/17 1113 Oral     SpO2 05/01/17 1113 97 %     Weight 05/01/17 1114 130 lb (59 kg)     Height 05/01/17 1114 5\' 8"  (1.727 m)  Head Circumference --      Peak Flow --      Pain Score 05/01/17 1114 5     Pain Loc --      Pain Edu? --      Excl. in GC? --    Constitutional: Alert and oriented. Well appearing and in no acute distress. Eyes: Conjunctivae are erythematous l. PERRL. EOMI. purulent drainage lateral aspect of left eye. Head: Atraumatic. Nose: No congestion/rhinnorhea. Mouth/Throat: Mucous membranes are moist.  Oropharynx non-erythematous. Cardiovascular: Normal rate, regular rhythm. Grossly normal heart sounds.  Good peripheral  circulation. Respiratory: Normal respiratory effort.  No retractions. Lungs CTAB. Gastrointestinal: Soft and nontender. No distention. No abdominal bruits. No CVA  ____________________________________________   LABS (all labs ordered are listed, but only abnormal results are displayed)  Labs Reviewed - No data to display ____________________________________________  EKG   ____________________________________________  RADIOLOGY  ED MD interpretation:    Official radiology report(s): No results found.  ____________________________________________   PROCEDURES  Procedure(s) performed: None  Procedures  Critical Care performed: No  ____________________________________________   INITIAL IMPRESSION / ASSESSMENT AND PLAN / ED COURSE  As part of my medical decision making, I reviewed the following data within the electronic MEDICAL RECORD NUMBER    Patient complained of 2 days yellowish-greenish drainage from left eye.  Patient also stated matted eyelids in the morning.  Patient given discharge care instructions and advised to use eyedrops as directed.  Patient advised to follow-up with ophthalmology if worsening of complaint.   ____________________________________________   FINAL CLINICAL IMPRESSION(S) / ED DIAGNOSES  Final diagnoses:  Acute bacterial conjunctivitis of left eye     ED Discharge Orders        Ordered    gentamicin (GARAMYCIN) 0.3 % ophthalmic solution  4 times daily     05/01/17 1132    naphazoline (NAPHCON) 0.1 % ophthalmic solution  4 times daily PRN     05/01/17 1132       Note:  This document was prepared using Dragon voice recognition software and may include unintentional dictation errors.    Joni Reining, PA-C 05/01/17 1138    Emily Filbert, MD 05/01/17 1308

## 2017-05-01 NOTE — ED Triage Notes (Signed)
Patient here with right eye redness, pain, and occasional itching.  Denies visual changes, denies injury.  Sclera is reddened with some swelling of upper eyelid noted.  Denies exposure to anyone with similar sx.  Denies drainage.

## 2017-05-01 NOTE — ED Notes (Signed)
See triage note presents with right eye redness and irritation for about 1 week  Also having some swelling to eyelid   Denies any injury

## 2019-10-02 ENCOUNTER — Emergency Department
Admission: EM | Admit: 2019-10-02 | Discharge: 2019-10-02 | Disposition: A | Discharge status: Home / Self Care | Payer: Self-pay | Attending: Emergency Medicine | Admitting: Emergency Medicine

## 2019-10-02 DIAGNOSIS — S01312A Laceration without foreign body of left ear, initial encounter: Secondary | ICD-10-CM | POA: Insufficient documentation

## 2019-10-02 DIAGNOSIS — F17210 Nicotine dependence, cigarettes, uncomplicated: Secondary | ICD-10-CM | POA: Insufficient documentation

## 2019-10-02 MED ORDER — CEPHALEXIN 500 MG PO CAPS: 500.0000 mg | ORAL_CAPSULE | Freq: Four times a day (QID) | ORAL | 0 refills | Status: AC

## 2019-10-02 MED ORDER — AMOXICILLIN-POT CLAVULANATE 875-125 MG PO TABS: 1.0000 | ORAL_TABLET | Freq: Two times a day (BID) | ORAL | 0 refills | Status: DC

## 2019-10-02 NOTE — ED Notes (Signed)
PA in to evaluate patient while in triage. Patient with abrasion to the top of the pinna. Area is clean without bleeding or signs of infection noted. PA cleaning the area with sterile water and gauze pad.

## 2019-10-02 NOTE — ED Notes (Signed)
Discharge instructions reviewed and patient verbalized understanding
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
## 2020-08-07 DIAGNOSIS — A539 Syphilis, unspecified: Secondary | ICD-10-CM | POA: Insufficient documentation

## 2020-08-14 ENCOUNTER — Ambulatory Visit: Payer: Self-pay | Admitting: Physician Assistant

## 2020-08-14 ENCOUNTER — Other Ambulatory Visit: Payer: Self-pay

## 2020-08-14 DIAGNOSIS — A539 Syphilis, unspecified: Secondary | ICD-10-CM

## 2020-08-14 DIAGNOSIS — Z113 Encounter for screening for infections with a predominantly sexual mode of transmission: Secondary | ICD-10-CM

## 2020-08-14 MED ORDER — PENICILLIN G BENZATHINE 1200000 UNIT/2ML IM SUSY
2.4000 10*6.[IU] | PREFILLED_SYRINGE | Freq: Once | INTRAMUSCULAR | Status: AC
  Administered 2020-08-14: 2.4 10*6.[IU] via INTRAMUSCULAR

## 2020-08-16 ENCOUNTER — Encounter: Payer: Self-pay | Admitting: Physician Assistant

## 2020-08-16 NOTE — Progress Notes (Signed)
   [facility] Department STI clinic/screening visit  Subjective:  Tony Conley is a 35 y.o. male being seen today for an STI screening visit. The patient reports they do have symptoms.    Patient has the following medical conditions:  There are no problems to display for this patient.    Chief Complaint  Patient presents with   SEXUALLY TRANSMITTED DISEASE    screening    HPI  Patient reports that he was sent here for treatment of a positive Syphilis test done at [facility] about 1.5 weeks ago.  States that after he was tested he started having palmar and plantar rash.  Per DIS, titer at [facility] was 1:16 with TP-PA reacitve.  States that his HIV test was negative.  Declines any testing today and only wants treatment.   See flowsheet for further details and programmatic requirements.    The following portions of the patient's history were reviewed and updated as appropriate: allergies, current medications, past medical history, past social history, past surgical history and problem list.  Objective:  There were no vitals filed for this visit.  Physical Exam Constitutional:      General: He is not in acute distress.    Appearance: Normal appearance.  HENT:     Head: Normocephalic and atraumatic.     Comments: No nits,lice, or hair loss. No cervical, supraclavicular or axillary adenopathy.  Eyes:     Conjunctiva/sclera: Conjunctivae normal.  Pulmonary:     Effort: Pulmonary effort is normal.  Skin:    General: Skin is warm and dry.     Findings: Rash present.     Comments: Hyperpigmented macular, waxy rash on bilateral palms, buttocks and plantar surfaces.  Neurological:     Mental Status: He is alert and oriented to person, place, and time.  Psychiatric:        Mood and Affect: Mood normal.        Behavior: Behavior normal.        Thought Content: Thought content normal.        Judgment: Judgment normal.      Assessment and Plan:  Tony Conley is a  35 y.o. male presenting to the [facility] Department for STI screening  1. Screening for STD (sexually transmitted disease) Patient into clinic with symptoms. Patient declines screening today and requests treatment only. Rec condoms with all sex.   2. Syphilis Will treat for Syphilis with Bicillin 2.4 mu IM today. No sex for 14 days and until after partner has completed treatment. Counseled patient re: normal SE of injections and to use OTC analgesics and warm compresses as needed. Patient observed for 20 min after injections and was released without complications.  - penicillin g benzathine (BICILLIN LA) 1200000 UNIT/2ML injection 2.4 Million Units     No follow-ups on file.  No future appointments.  Matt Holmes, PA

## 2021-03-18 ENCOUNTER — Emergency Department
Admission: EM | Admit: 2021-03-18 | Discharge: 2021-03-18 | Disposition: A | Discharge status: Home / Self Care | Payer: Self-pay | Attending: Student in an Organized Health Care Education/Training Program | Admitting: Student in an Organized Health Care Education/Training Program

## 2021-03-18 ENCOUNTER — Encounter: Payer: Self-pay | Admitting: Emergency Medicine

## 2021-03-18 ENCOUNTER — Other Ambulatory Visit: Payer: Self-pay

## 2021-03-18 DIAGNOSIS — J029 Acute pharyngitis, unspecified: Secondary | ICD-10-CM | POA: Insufficient documentation

## 2021-03-18 DIAGNOSIS — R638 Other symptoms and signs concerning food and fluid intake: Secondary | ICD-10-CM | POA: Insufficient documentation

## 2021-03-18 DIAGNOSIS — Z8616 Personal history of COVID-19: Secondary | ICD-10-CM | POA: Insufficient documentation

## 2021-03-18 DIAGNOSIS — H9202 Otalgia, left ear: Secondary | ICD-10-CM | POA: Insufficient documentation

## 2021-03-18 MED ORDER — AMOXICILLIN 250 MG PO CAPS: 250.0000 mg | ORAL_CAPSULE | Freq: Three times a day (TID) | ORAL | 0 refills | Status: AC

## 2021-03-18 MED ORDER — DEXAMETHASONE 4 MG PO TABS
10.0000 mg | ORAL_TABLET | Freq: Once | ORAL | Status: AC
  Administered 2021-03-18: 10 mg via ORAL
  Filled 2021-03-18: qty 3

## 2021-03-18 NOTE — ED Provider Notes (Signed)
? ?[facility] ?Provider Note ? ? ? Event Date/Time  ? First MD Initiated Contact with Patient 03/18/21 [phone]   ?  (approximate) ? ? ?History  ? ?Sore Throat and Fever ? ? ?HPI ? ?Tony Conley is a 36 y.o. male   with recent diagnosis of COVID presents to the ER for worsening left side of throat pain and ear pain.  Is still having fevers chills.  Reports decreased p.o. intake.  Denies any abdominal pain no nausea or vomiting.  Not currently on antibiotics.  Diffuse temp and ostomy tubes as a child.  No trouble speaking tolerating liquids. ? ?  ? ? ?Physical Exam  ? ?Triage Vital Signs: ?ED Triage Vitals  ?Enc Vitals Group  ?   BP 03/18/21 0906 127/90  ?   Pulse Rate 03/18/21 0906 80  ?   Resp 03/18/21 0906 16  ?   Temp 03/18/21 0906 98.6 ?F (37 ?C)  ?   Temp Source 03/18/21 0906 Oral  ?   SpO2 03/18/21 0906 97 %  ?   Weight 03/18/21 0906 155 lb (70.3 kg)  ?   Height 03/18/21 0906 5\' 9"  (1.753 m)  ?   Head Circumference --   ?   Peak Flow --   ?   Pain Score 03/18/21 0905 0  ?   Pain Loc --   ?   Pain Edu? --   ?   Excl. in [place]? --   ? ? ?Most recent vital signs: ?Vitals:  ? 03/18/21 0906  ?BP: 127/90  ?Pulse: 80  ?Resp: 16  ?Temp: 98.6 ?F (37 ?C)  ?SpO2: 97%  ? ? ? ?Constitutional: Alert  ?Eyes: Conjunctivae are normal.  ?Head: Atraumatic. ?Nose: No congestion/rhinnorhea.   ?Mouth/Throat: Mucous membranes are moist.   Bilateral tonsillar erythema no exudates uvula midline no trismus.  No sublingual tenderness palpation. ?Neck: Painless ROM.  Tender anterior cervical chain lymphadenopathy.  Left TM dull sunken with purulent effusion right TM normal. ?Cardiovascular:   Good peripheral circulation. ?Respiratory: Normal respiratory effort.  No retractions.  ?Gastrointestinal: Soft and nontender.  ?Musculoskeletal:  no deformity ?Neurologic:  MAE spontaneously. No gross focal neurologic deficits are appreciated.  ?Skin:  Skin is warm, dry and intact. No rash noted. ?Psychiatric: Mood and affect  are normal. Speech and behavior are normal. ? ? ? ?ED Results / Procedures / Treatments  ? ?Labs ?(all labs ordered are listed, but only abnormal results are displayed) ?Labs Reviewed - No data to display ? ? ?EKG ? ? ? ? ?RADIOLOGY ? ? ? ?PROCEDURES: ? ?Critical Care performed:  ? ?Procedures ? ? ?MEDICATIONS ORDERED IN ED: ?Medications  ?dexamethasone (DECADRON) tablet 10 mg (has no administration in time range)  ? ? ? ?IMPRESSION / MDM / ASSESSMENT AND PLAN / ED COURSE  ?I reviewed the triage vital signs and the nursing notes. ?             ?               ? ?Differential diagnosis includes, but is not limited to, URI, COVID, pharyngitis, AOM, AOE ? ?Patient presenting with symptoms as described above.  He is nontoxic afebrile hemodynamically stable.  Symptoms may be related to persistent COVID symptoms but given acute worsening with findings concerning for AOM on the left side will cover with antibiotics for ENT infection.  Not consistent with PTA or RPA.  He is tolerating p.o.  We discussed outpatient follow-up.  Patient  agreeable to plan. ? ? ?  ? ? ?FINAL CLINICAL IMPRESSION(S) / ED DIAGNOSES  ? ?Final diagnoses:  ?Pharyngitis, unspecified etiology  ? ? ? ?Rx / DC Orders  ? ?ED Discharge Orders   ? ?      Ordered  ?  amoxicillin (AMOXIL) 250 MG capsule  3 times daily       ? 03/18/21 0914  ? ?  ?  ? ?  ? ? ? ?Note:  This document was prepared using Dragon voice recognition software and may include unintentional dictation errors. ? ?  ?Merlyn Lot, MD ?03/18/21 [phone] ? ?

## 2021-03-18 NOTE — ED Triage Notes (Signed)
Pt via POV from home. Pt c/o fever, sore throat, and cough for the past 4 days. States that he did have the fever the first day. Pt is A&Ox4 and NAD.  ?

## 2022-01-21 ENCOUNTER — Emergency Department
Admission: EM | Admit: 2022-01-21 | Discharge: 2022-01-21 | Disposition: A | Discharge status: Home / Self Care | Payer: Self-pay | Attending: Emergency Medicine | Admitting: Emergency Medicine

## 2022-01-21 ENCOUNTER — Emergency Department: Payer: Self-pay

## 2022-01-21 ENCOUNTER — Other Ambulatory Visit: Payer: Self-pay

## 2022-01-21 DIAGNOSIS — M659 Synovitis and tenosynovitis, unspecified: Secondary | ICD-10-CM | POA: Insufficient documentation

## 2022-01-21 DIAGNOSIS — Z21 Asymptomatic human immunodeficiency virus [HIV] infection status: Secondary | ICD-10-CM | POA: Insufficient documentation

## 2022-01-21 MED ORDER — MELOXICAM 15 MG PO TABS: 15.0000 mg | ORAL_TABLET | Freq: Every day | ORAL | 2 refills | Status: AC

## 2022-01-21 NOTE — ED Triage Notes (Signed)
Pt to ED via POV from home. Pt reports injury to left hand that happened at work. Pt states this is not worker's comp. Pt A&Ox4. Pt ambulatory to triage. Pt has brace on left hand/wrist.

## 2022-01-21 NOTE — Discharge Instructions (Signed)
Follow up with [facility], ask for hand specialist Take the meloxicam daily, do not take ibuprofen or aleve with this medication You may take tylenol as needed  Apply ice to left hand

## 2022-01-21 NOTE — ED Provider Notes (Signed)
[facility] Provider Note    Event Date/Time   First MD Initiated Contact with Patient 01/21/22 1319     (approximate)   History   Hand Pain   HPI  Tony Conley is a 37 y.o. male with history of HIV presents emergency department complaining of left thumb pain.  Patient states that his job put him on a position where he has to twist and grab.  States pain in his hands got worse.  Did have an injury a couple of weeks ago.  Patient has been wearing a hand brace thinking that he had carpal tunnel.  No numbness or tingling      Physical Exam   Triage Vital Signs: ED Triage Vitals  Enc Vitals Group     BP 01/21/22 1244 126/77     Pulse Rate 01/21/22 1244 68     Resp 01/21/22 1244 16     Temp 01/21/22 1244 98.2 F (36.8 C)     Temp src --      SpO2 01/21/22 1244 97 %     Weight 01/21/22 1244 150 lb (68 kg)     Height 01/21/22 1244 5\' 9"  (1.753 m)     Head Circumference --      Peak Flow --      Pain Score 01/21/22 1227 5     Pain Loc --      Pain Edu? --      Excl. in [place]? --     Most recent vital signs: Vitals:   01/21/22 1244  BP: 126/77  Pulse: 68  Resp: 16  Temp: 98.2 F (36.8 C)  SpO2: 97%     General: Awake, no distress.   CV:  Good peripheral perfusion. regular rate and  rhythm Resp:  Normal effort.  Abd:  No distention.   Other:  Left thumb tender along the extensor tendon.  Patient is also tender in the snuffbox, neurovascular is intact   ED Results / Procedures / Treatments   Labs (all labs ordered are listed, but only abnormal results are displayed) Labs Reviewed - No data to display   EKG     RADIOLOGY X-ray of the left hand    PROCEDURES:   Procedures   MEDICATIONS ORDERED IN ED: Medications - No data to display   IMPRESSION / MDM / [place] / ED COURSE  I reviewed the triage vital signs and the nursing notes.                              Differential diagnosis includes, but is  not limited to, tendinitis, fracture, sprain, contusion  Patient's presentation is most consistent with acute complicated illness / injury requiring diagnostic workup.   X-ray of the left hand independently reviewed and interpreted by me as being negative  The patient was placed in a wrist brace with thumb ABD, he is to follow-up with [facility] and ask for hand specialist.  No use of the left hand until evaluated by orthopedics. The patient did ask about FMLA forms, I did inform him that the emergency department does not fill out FMLA forms and this should come from the specialist or his regular doctor.  He is in agreement treatment plan.  He was started on meloxicam 15 mg daily daily.  He is to stop taking ibuprofen, Aleve with this medication.  He can also take Tylenol  FINAL CLINICAL IMPRESSION(S) / ED DIAGNOSES   Final diagnoses:  Tenosynovitis of finger     Rx / DC Orders   ED Discharge Orders          Ordered    meloxicam (MOBIC) 15 MG tablet  Daily        01/21/22 1329             Note:  This document was prepared using Dragon voice recognition software and may include unintentional dictation errors.    Versie Starks, PA-C 01/21/22 1337    Lucillie Garfinkel, MD 01/22/22 [phone]

## 2022-10-01 ENCOUNTER — Other Ambulatory Visit: Payer: Self-pay

## 2022-10-01 ENCOUNTER — Emergency Department
Admission: EM | Admit: 2022-10-01 | Discharge: 2022-10-01 | Disposition: A | Discharge status: Home / Self Care | Payer: Medicaid Other | Attending: Emergency Medicine | Admitting: Emergency Medicine

## 2022-10-01 ENCOUNTER — Encounter: Payer: Self-pay | Admitting: *Deleted

## 2022-10-01 DIAGNOSIS — D72829 Elevated white blood cell count, unspecified: Secondary | ICD-10-CM | POA: Diagnosis not present

## 2022-10-01 DIAGNOSIS — K529 Noninfective gastroenteritis and colitis, unspecified: Secondary | ICD-10-CM | POA: Diagnosis not present

## 2022-10-01 DIAGNOSIS — R112 Nausea with vomiting, unspecified: Secondary | ICD-10-CM | POA: Diagnosis present

## 2022-10-01 LAB — CBC
HCT: 44.4 % (ref 39.0–52.0)
Hemoglobin: 16.1 g/dL (ref 13.0–17.0)
MCH: 34 pg (ref 26.0–34.0)
MCHC: 36.3 g/dL — ABNORMAL HIGH (ref 30.0–36.0)
MCV: 93.7 fL (ref 80.0–100.0)
Platelets: 152 10*3/uL (ref 150–400)
RBC: 4.74 MIL/uL (ref 4.22–5.81)
RDW: 11.2 % — ABNORMAL LOW (ref 11.5–15.5)
WBC: 14.2 10*3/uL — ABNORMAL HIGH (ref 4.0–10.5)
nRBC: 0 % (ref 0.0–0.2)

## 2022-10-01 LAB — COMPREHENSIVE METABOLIC PANEL
ALT: 68 U/L — ABNORMAL HIGH (ref 0–44)
AST: 41 U/L (ref 15–41)
Albumin: 4.5 g/dL (ref 3.5–5.0)
Alkaline Phosphatase: 80 U/L (ref 38–126)
Anion gap: 11 (ref 5–15)
BUN: 12 mg/dL (ref 6–20)
CO2: 21 mmol/L — ABNORMAL LOW (ref 22–32)
Calcium: 9.2 mg/dL (ref 8.9–10.3)
Chloride: 103 mmol/L (ref 98–111)
Creatinine, Ser: 0.99 mg/dL (ref 0.61–1.24)
GFR, Estimated: >60 mL/min (ref >60)
Glucose, Bld: 120 mg/dL — ABNORMAL HIGH (ref 70–99)
Potassium: 3.4 mmol/L — ABNORMAL LOW (ref 3.5–5.1)
Sodium: 135 mmol/L (ref 135–145)
Total Bilirubin: 0.9 mg/dL (ref 0.3–1.2)
Total Protein: 8 g/dL (ref 6.5–8.1)

## 2022-10-01 LAB — LIPASE, BLOOD: Lipase: 22 U/L (ref 11–51)

## 2022-10-01 MED ORDER — SODIUM CHLORIDE 0.9 % IV SOLN: Freq: Once | INTRAVENOUS | Status: AC

## 2022-10-01 MED ORDER — ONDANSETRON HCL 4 MG/2ML IJ SOLN
4.0000 mg | Freq: Once | INTRAMUSCULAR | Status: AC
  Administered 2022-10-01: 4 mg via INTRAVENOUS
  Filled 2022-10-01: qty 2

## 2022-10-01 MED ORDER — ONDANSETRON 4 MG PO TBDP: 4.0000 mg | ORAL_TABLET | Freq: Three times a day (TID) | ORAL | 0 refills | Status: DC | PRN

## 2022-10-01 NOTE — ED Triage Notes (Signed)
N/V/D which began yesterday.  Pt has had multiple episodes of vomiting and diarrhea.  He reports that he thinks he has food poisioning.

## 2022-10-01 NOTE — ED Notes (Signed)
See triage notes. Patient c/o N/V/D and believes he has salmonella/food poisoning due to cross contamination from chicken two days ago.

## 2022-10-01 NOTE — ED Provider Notes (Signed)
[facility] Provider Note    Event Date/Time   First MD Initiated Contact with Patient 10/01/22 [phone]     (approximate)   History   Emesis and Abdominal Pain   HPI  Tony Conley is a 37 y.o. male with no significant past medical history presents with nausea vomiting diarrhea x 2 days.  He is concerned that he may have food poisoning.  Reports abdominal cramping relieved by diarrhea     Physical Exam   Triage Vital Signs: ED Triage Vitals  Encounter Vitals Group     BP 10/01/22 0818 (!) 90/52     Systolic BP Percentile --      Diastolic BP Percentile --      Pulse Rate 10/01/22 0818 78     Resp 10/01/22 0818 16     Temp 10/01/22 0815 98.2 F (36.8 C)     Temp Source 10/01/22 0818 Oral     SpO2 10/01/22 0818 97 %     Weight 10/01/22 0819 68 kg (150 lb)     Height --      Head Circumference --      Peak Flow --      Pain Score 10/01/22 0819 8     Pain Loc --      Pain Education --      Exclude from Growth Chart --     Most recent vital signs: Vitals:   10/01/22 0818 10/01/22 0819  BP: (!) 90/52 98/66  Pulse: 78   Resp: 16   Temp: 98.5 F (36.9 C)   SpO2: 97%      General: Awake, no distress.  CV:  Good peripheral perfusion.  Resp:  Normal effort.  Abd:  No distention.  Soft, nontender Other:     ED Results / Procedures / Treatments   Labs (all labs ordered are listed, but only abnormal results are displayed) Labs Reviewed  COMPREHENSIVE METABOLIC PANEL - Abnormal; Notable for the following components:      Result Value   Potassium 3.4 (*)    CO2 21 (*)    Glucose, Bld 120 (*)    ALT 68 (*)    All other components within normal limits  CBC - Abnormal; Notable for the following components:   WBC 14.2 (*)    MCHC 36.3 (*)    RDW 11.2 (*)    All other components within normal limits  LIPASE, BLOOD  URINALYSIS, ROUTINE W REFLEX MICROSCOPIC     EKG     RADIOLOGY     PROCEDURES:  Critical Care  performed:   Procedures   MEDICATIONS ORDERED IN ED: Medications  0.9 %  sodium chloride infusion ( Intravenous New Bag/Given 10/01/22 0908)  ondansetron (ZOFRAN) injection 4 mg (4 mg Intravenous Given 10/01/22 0908)     IMPRESSION / MDM / ASSESSMENT AND PLAN / ED COURSE  I reviewed the triage vital signs and the nursing notes. Patient's presentation is most consistent with acute illness / injury with system symptoms.  Patient presents with nausea vomiting diarrhea as detailed above, differential includes foodborne illness versus viral gastroenteritis.  Suspect mild dehydration based on blood pressure, will give a liter of IV fluid, IV Zofran, check labs and reevaluate.  ----------------------------------------- 9:49 AM on 10/01/2022 ----------------------------------------- Patient feeling better after treatment, appropriate for discharge at this time, lab work reviewed, mild elevation of white blood cell count likely related to viral gastroenteritis, return precautions discussed, patient is comfortable with this plan.  FINAL CLINICAL IMPRESSION(S) / ED DIAGNOSES   Final diagnoses:  Gastroenteritis     Rx / DC Orders   ED Discharge Orders          Ordered    ondansetron (ZOFRAN-ODT) 4 MG disintegrating tablet  Every 8 hours PRN        10/01/22 0949             Note:  This document was prepared using Dragon voice recognition software and may include unintentional dictation errors.   Jene Every, MD 10/01/22 [phone]

## 2022-10-06 ENCOUNTER — Ambulatory Visit: Payer: Self-pay

## 2022-10-06 ENCOUNTER — Encounter: Payer: Self-pay | Admitting: Emergency Medicine

## 2022-10-06 ENCOUNTER — Emergency Department: Payer: Medicaid Other

## 2022-10-06 ENCOUNTER — Emergency Department
Admission: EM | Admit: 2022-10-06 | Discharge: 2022-10-06 | Disposition: A | Discharge status: Home / Self Care | Payer: Medicaid Other | Attending: Emergency Medicine | Admitting: Emergency Medicine

## 2022-10-06 ENCOUNTER — Other Ambulatory Visit: Payer: Self-pay

## 2022-10-06 DIAGNOSIS — R748 Abnormal levels of other serum enzymes: Secondary | ICD-10-CM | POA: Diagnosis not present

## 2022-10-06 DIAGNOSIS — D72829 Elevated white blood cell count, unspecified: Secondary | ICD-10-CM | POA: Diagnosis not present

## 2022-10-06 DIAGNOSIS — R197 Diarrhea, unspecified: Secondary | ICD-10-CM | POA: Diagnosis not present

## 2022-10-06 DIAGNOSIS — E876 Hypokalemia: Secondary | ICD-10-CM | POA: Diagnosis not present

## 2022-10-06 DIAGNOSIS — R509 Fever, unspecified: Secondary | ICD-10-CM | POA: Diagnosis not present

## 2022-10-06 DIAGNOSIS — R1031 Right lower quadrant pain: Secondary | ICD-10-CM | POA: Diagnosis not present

## 2022-10-06 DIAGNOSIS — K529 Noninfective gastroenteritis and colitis, unspecified: Secondary | ICD-10-CM | POA: Diagnosis not present

## 2022-10-06 DIAGNOSIS — R112 Nausea with vomiting, unspecified: Secondary | ICD-10-CM | POA: Insufficient documentation

## 2022-10-06 DIAGNOSIS — R1032 Left lower quadrant pain: Secondary | ICD-10-CM | POA: Insufficient documentation

## 2022-10-06 DIAGNOSIS — R1030 Lower abdominal pain, unspecified: Secondary | ICD-10-CM

## 2022-10-06 LAB — COMPREHENSIVE METABOLIC PANEL
ALT: 24 U/L (ref 0–44)
AST: 14 U/L — ABNORMAL LOW (ref 15–41)
Albumin: 3.8 g/dL (ref 3.5–5.0)
Alkaline Phosphatase: 70 U/L (ref 38–126)
Anion gap: 10 (ref 5–15)
BUN: 8 mg/dL (ref 6–20)
CO2: 25 mmol/L (ref 22–32)
Calcium: 8.7 mg/dL — ABNORMAL LOW (ref 8.9–10.3)
Chloride: 101 mmol/L (ref 98–111)
Creatinine, Ser: 0.96 mg/dL (ref 0.61–1.24)
GFR, Estimated: >60 mL/min (ref >60)
Glucose, Bld: 117 mg/dL — ABNORMAL HIGH (ref 70–99)
Potassium: 2.8 mmol/L — ABNORMAL LOW (ref 3.5–5.1)
Sodium: 136 mmol/L (ref 135–145)
Total Bilirubin: 0.8 mg/dL (ref 0.3–1.2)
Total Protein: 7.4 g/dL (ref 6.5–8.1)

## 2022-10-06 LAB — URINALYSIS, ROUTINE W REFLEX MICROSCOPIC
Bilirubin Urine: NEGATIVE
Glucose, UA: NEGATIVE mg/dL
Hgb urine dipstick: NEGATIVE
Ketones, ur: NEGATIVE mg/dL
Leukocytes,Ua: NEGATIVE
Nitrite: NEGATIVE
Protein, ur: NEGATIVE mg/dL
Specific Gravity, Urine: >1.046 — ABNORMAL HIGH (ref 1.005–1.030)
pH: 7 (ref 5.0–8.0)

## 2022-10-06 LAB — CBC
HCT: 42.2 % (ref 39.0–52.0)
Hemoglobin: 15.5 g/dL (ref 13.0–17.0)
MCH: 33.8 pg (ref 26.0–34.0)
MCHC: 36.7 g/dL — ABNORMAL HIGH (ref 30.0–36.0)
MCV: 91.9 fL (ref 80.0–100.0)
Platelets: 192 10*3/uL (ref 150–400)
RBC: 4.59 MIL/uL (ref 4.22–5.81)
RDW: 11.3 % — ABNORMAL LOW (ref 11.5–15.5)
WBC: 13.9 10*3/uL — ABNORMAL HIGH (ref 4.0–10.5)
nRBC: 0 % (ref 0.0–0.2)

## 2022-10-06 LAB — LIPASE, BLOOD: Lipase: 63 U/L — ABNORMAL HIGH (ref 11–51)

## 2022-10-06 MED ORDER — AMOXICILLIN-POT CLAVULANATE 875-125 MG PO TABS: 1.0000 | ORAL_TABLET | Freq: Two times a day (BID) | ORAL | 0 refills | Status: AC

## 2022-10-06 MED ORDER — ONDANSETRON 4 MG PO TBDP: 4.0000 mg | ORAL_TABLET | Freq: Three times a day (TID) | ORAL | 0 refills | Status: AC | PRN

## 2022-10-06 MED ORDER — HYDROCODONE-ACETAMINOPHEN 5-325 MG PO TABS
1.0000 | ORAL_TABLET | ORAL | 0 refills | Status: AC | PRN
Start: 2022-10-06 — End: ?

## 2022-10-06 MED ORDER — POTASSIUM CHLORIDE CRYS ER 20 MEQ PO TBCR
40.0000 meq | EXTENDED_RELEASE_TABLET | Freq: Once | ORAL | Status: AC
  Administered 2022-10-06: 40 meq via ORAL
  Filled 2022-10-06: qty 2

## 2022-10-06 MED ORDER — HYDROCODONE-ACETAMINOPHEN 5-325 MG PO TABS
1.0000 | ORAL_TABLET | ORAL | 0 refills | Status: DC | PRN
Start: 2022-10-06 — End: 2022-10-06

## 2022-10-06 MED ORDER — POTASSIUM CHLORIDE CRYS ER 20 MEQ PO TBCR: 20.0000 meq | EXTENDED_RELEASE_TABLET | Freq: Two times a day (BID) | ORAL | 0 refills | Status: AC

## 2022-10-06 MED ORDER — IOHEXOL 300 MG/ML  SOLN
100.0000 mL | Freq: Once | INTRAMUSCULAR | Status: AC | PRN
  Administered 2022-10-06: 100 mL via INTRAVENOUS

## 2022-10-06 MED ORDER — ONDANSETRON HCL 4 MG/2ML IJ SOLN
4.0000 mg | Freq: Once | INTRAMUSCULAR | Status: AC
  Administered 2022-10-06: 4 mg via INTRAVENOUS
  Filled 2022-10-06: qty 2

## 2022-10-06 MED ORDER — LACTATED RINGERS IV BOLUS
1000.0000 mL | Freq: Once | INTRAVENOUS | Status: AC
  Administered 2022-10-06: 1000 mL via INTRAVENOUS

## 2022-10-06 MED ORDER — POTASSIUM CHLORIDE CRYS ER 20 MEQ PO TBCR: 20.0000 meq | EXTENDED_RELEASE_TABLET | Freq: Two times a day (BID) | ORAL | 0 refills | Status: DC

## 2022-10-06 MED ORDER — ONDANSETRON 4 MG PO TBDP: 4.0000 mg | ORAL_TABLET | Freq: Three times a day (TID) | ORAL | 0 refills | Status: DC | PRN

## 2022-10-06 NOTE — ED Triage Notes (Signed)
Pt here with emesis and diarrhea. Pt states he had a 102 fever this past week. Pt states his abd pain is all lower and is having severe pain in that area.

## 2022-10-06 NOTE — Telephone Encounter (Signed)
  Chief Complaint: Abdominal pain, vomiting and diarrhea Symptoms: above Frequency: 10/01/2022 Pertinent Negatives: Patient denies  Disposition: [x] ED /[] Urgent Care (no appt availability in office) / [] Appointment(In office/virtual)/ []  [place] [facility]/ [] Home Care/ [] Refused Recommended Disposition /[] [place] [facility]/ []  Follow-up with PCP Additional Notes: Pt was seen in ED on 9/25 for this. S/s have not resolved. Pt states that diarrhea, pain and vomiting continue. Pt states he feels weak.  Pt will return to ED for care.    Reason for Disposition  [1] SEVERE pain (e.g., excruciating) AND [2] present > 1 hour  Answer Assessment - Initial Assessment Questions 1. LOCATION: "Where does it hurt?"      abdomen  3. ONSET: "When did the pain begin?" (Minutes, hours or days ago)      10/01/2022 4. SUDDEN: "Gradual or sudden onset?"     Gradual 5. PATTERN "Does the pain come and go, or is it constant?"    - If it comes and goes: "How long does it last?" "Do you have pain now?"     (Note: Comes and goes means the pain is intermittent. It goes away completely between bouts.)    - If constant: "Is it getting better, staying the same, or getting worse?"      (Note: Constant means the pain never goes away completely; most serious pain is constant and gets worse.)      constant 6. SEVERITY: "How bad is the pain?"  (e.g., Scale 1-10; mild, moderate, or severe)    - MILD (1-3): Doesn't interfere with normal activities, abdomen soft and not tender to touch.     - MODERATE (4-7): Interferes with normal activities or awakens from sleep, abdomen tender to touch.     - SEVERE (8-10): Excruciating pain, doubled over, unable to do any normal activities.       moderate 7. RECURRENT SYMPTOM: "Have you ever had this type of stomach pain before?" If Yes, ask: "When was the last time?" and "What happened that time?"      Ongoing since 10/01/2022 8. CAUSE: "What do you think is causing the  stomach pain?"     unsure 10. OTHER SYMPTOMS: "Do you have any other symptoms?" (e.g., back pain, diarrhea, fever, urination pain, vomiting)       Vomiting , diarrhea, weakness  Protocols used: Abdominal Pain - Male-A-AH

## 2022-10-06 NOTE — Discharge Instructions (Addendum)
Please take your nausea medication pain medication as needed but only as prescribed.  Do not drink alcohol or drive while taking pain medication.  Please take your potassium supplements twice daily for the next 5 days.  Return to the emergency department for any worsening pain if you are unable to tolerate fluids due to vomiting or any other symptom personally concerning to yourself.  Otherwise please follow-up with your doctor in 2 to 3 days for recheck/reevaluation.

## 2022-10-06 NOTE — ED Notes (Signed)
..  The patient is A&OX4, ambulatory at d/c with independent steady gait, NAD. Pt verbalized understanding of d/c instructions, prescriptions and follow up care.  

## 2022-10-06 NOTE — ED Provider Notes (Signed)
-----------------------------------------   4:19 PM on 10/06/2022 -----------------------------------------  CT shows findings of colitis.  On reassessment the patient is comfortable appearing.  He is stable for discharge home.  I counseled him on the results of the workup.  He has been prescribed a 1 week course of Augmentin as well as pain and nausea medication and potassium repletion.  I gave him strict return precautions and he expressed understanding.  CT abdomen/pelvis:  IMPRESSION:  Mild diffuse colitis, predominantly involving the ascending and  transverse colon.     Dionne Bucy, MD 10/06/22 [phone]

## 2022-10-06 NOTE — ED Provider Notes (Signed)
[facility] Provider Note    Event Date/Time   First MD Initiated Contact with Patient 10/06/22 1319     (approximate)  History   Chief Complaint: Emesis  HPI  Tony Conley is a 37 y.o. male with no significant past medical history who presents to the emergency department for nausea vomiting diarrhea and lower abdominal pain.  According to the patient for the past week or so he has had nausea with intermittent diarrhea at times and has noted some streaks of blood in the stool.  States he has been vomiting on occasion although has not done so in 2 days.  Patient states he was seen in the emergency department for the same 1 week ago.  Per record review patient was seen in the emergency department 10/01/2022 for similar symptoms of nausea vomiting diarrhea had a reassuring workup improved after medications and fluids and was discharged home.  Patient states he has continued to have similar symptoms so he came back to the emergency department today for evaluation.  Physical Exam   Triage Vital Signs: ED Triage Vitals  Encounter Vitals Group     BP 10/06/22 1217 123/79     Systolic BP Percentile --      Diastolic BP Percentile --      Pulse Rate 10/06/22 1217 77     Resp 10/06/22 1217 17     Temp 10/06/22 1217 98.3 F (36.8 C)     Temp Source 10/06/22 1217 Oral     SpO2 10/06/22 1217 98 %     Weight 10/06/22 1218 149 lb 14.6 oz (68 kg)     Height 10/06/22 1218 5\' 9"  (1.753 m)     Head Circumference --      Peak Flow --      Pain Score 10/06/22 1217 10     Pain Loc --      Pain Education --      Exclude from Growth Chart --     Most recent vital signs: Vitals:   10/06/22 1217  BP: 123/79  Pulse: 77  Resp: 17  Temp: 98.3 F (36.8 C)  SpO2: 98%    General: Awake, no distress.  CV:  Good peripheral perfusion.  Regular rate and rhythm  Resp:  Normal effort.  Equal breath sounds bilaterally.  Abd:  No distention.  Soft, mild to moderate lower  abdominal pain/tenderness in both the left lower quadrant and right lower quadrant.  No rebound or guarding.  No distention.  ED Results / Procedures / Treatments   RADIOLOGY  I have reviewed and interpreted CT images.  Patient does appear to have some mild inflammatory changes in the lower abdomen awaiting radiology read to further differentiate.   MEDICATIONS ORDERED IN ED: Medications  lactated ringers bolus 1,000 mL (has no administration in time range)  potassium chloride SA (KLOR-CON M) CR tablet 40 mEq (has no administration in time range)  ondansetron (ZOFRAN) injection 4 mg (has no administration in time range)     IMPRESSION / MDM / ASSESSMENT AND PLAN / ED COURSE  I reviewed the triage vital signs and the nursing notes.  Patient's presentation is most consistent with acute presentation with potential threat to life or bodily function.  Patient presents to the emergency department for nausea vomiting diarrhea and lower abdominal pain.  Patient's labs have resulted showing mild leukocytosis 13,900 otherwise reassuring CBC including H&H.  Chemistry shows mild hypokalemia which could be due to the patient's  diarrhea and vomiting otherwise reassuring chemistry with anion gap of 10.  Patient receiving IV fluids I have also dosed oral potassium and IV Zofran.  Patient's lipase is mildly elevated patient denies alcohol use he has no upper abdominal tenderness on my examination, although could possibly be indicative of a mild pancreatitis.  Normal LFTs.  Vital signs reassuring, no fever.  We will IV hydrate we will obtain CT imaging to further evaluate given the patient's mild to moderate lower abdominal tenderness to palpation.  Patient agreeable to plan of care.  Patient's lab work shows mild leukocytosis reassuring chemistry besides mild hypokalemia mild lipase elevation.  I have reviewed the CT images possible mild inflammatory changes or stranding in the lower abdomen.  Awaiting  radiology read.  Patient receiving fluids.    FINAL CLINICAL IMPRESSION(S) / ED DIAGNOSES   Abdominal pain Nausea vomiting diarrhea   Note:  This document was prepared using Dragon voice recognition software and may include unintentional dictation errors.   Minna Antis, MD 10/08/22 0425

## 2022-10-22 DIAGNOSIS — D696 Thrombocytopenia, unspecified: Secondary | ICD-10-CM | POA: Diagnosis not present

## 2022-10-22 DIAGNOSIS — A539 Syphilis, unspecified: Secondary | ICD-10-CM | POA: Diagnosis not present

## 2022-10-22 DIAGNOSIS — Z1389 Encounter for screening for other disorder: Secondary | ICD-10-CM | POA: Diagnosis not present

## 2022-10-22 DIAGNOSIS — B20 Human immunodeficiency virus [HIV] disease: Secondary | ICD-10-CM | POA: Diagnosis not present

## 2022-11-12 DIAGNOSIS — H5213 Myopia, bilateral: Secondary | ICD-10-CM | POA: Diagnosis not present

## 2023-02-24 ENCOUNTER — Emergency Department
Admission: EM | Admit: 2023-02-24 | Discharge: 2023-02-24 | Disposition: A | Discharge status: Home / Self Care | Payer: Medicaid Other | Attending: Emergency Medicine | Admitting: Emergency Medicine

## 2023-02-24 ENCOUNTER — Other Ambulatory Visit: Payer: Self-pay

## 2023-02-24 DIAGNOSIS — R059 Cough, unspecified: Secondary | ICD-10-CM | POA: Diagnosis present

## 2023-02-24 DIAGNOSIS — J101 Influenza due to other identified influenza virus with other respiratory manifestations: Secondary | ICD-10-CM | POA: Insufficient documentation

## 2023-02-24 DIAGNOSIS — R9431 Abnormal electrocardiogram [ECG] [EKG]: Secondary | ICD-10-CM | POA: Diagnosis not present

## 2023-02-24 LAB — RESP PANEL BY RT-PCR (RSV, FLU A&B, COVID)  RVPGX2
Influenza A by PCR: POSITIVE — AB
Influenza B by PCR: NEGATIVE
Resp Syncytial Virus by PCR: NEGATIVE
SARS Coronavirus 2 by RT PCR: NEGATIVE

## 2023-02-24 MED ORDER — ONDANSETRON 4 MG PO TBDP: 4.0000 mg | ORAL_TABLET | Freq: Three times a day (TID) | ORAL | 0 refills | Status: AC | PRN

## 2023-02-24 MED ORDER — KETOROLAC TROMETHAMINE 30 MG/ML IJ SOLN
30.0000 mg | Freq: Once | INTRAMUSCULAR | Status: AC
  Administered 2023-02-24: 30 mg via INTRAMUSCULAR
  Filled 2023-02-24: qty 1

## 2023-02-24 MED ORDER — ONDANSETRON 4 MG PO TBDP
4.0000 mg | ORAL_TABLET | Freq: Once | ORAL | Status: AC
  Administered 2023-02-24: 4 mg via ORAL
  Filled 2023-02-24: qty 1

## 2023-02-24 NOTE — Discharge Instructions (Addendum)
 Please take Tylenol and ibuprofen/Advil for your pain.  It is safe to take them together, or to alternate them every few hours.  Take up to 1000mg  of Tylenol at a time, up to 4 times per day.  Do not take more than 4000 mg of Tylenol in 24 hours.  For ibuprofen, take 400-600 mg, 3 - 4 times per day.  Zofran as needed for nausea and vomiting

## 2023-02-24 NOTE — ED Triage Notes (Signed)
Pt here with a cough, fever, and back pain. Pt endorses cp and sob. Pt endorses nausea and vomiting last night but denies diarrhea. Pt ambulatory to triage.

## 2023-02-24 NOTE — ED Provider Notes (Signed)
[facility] Provider Note    Event Date/Time   First MD Initiated Contact with Patient 02/24/23 1340     (approximate)   History   Back Pain and Cough   HPI  Tony Conley is a 38 y.o. male who presents to the ED for evaluation of Back Pain and Cough   Patient presents for evaluation of a few days of diffuse myalgias and a nonproductive cough.  No falls or injuries, reports back, chest, legs and "hurting everywhere."  Low-grade fevers.  1 episode of emesis last night.   Physical Exam   Triage Vital Signs: ED Triage Vitals  Encounter Vitals Group     BP 02/24/23 1248 (!) 141/98     Systolic BP Percentile --      Diastolic BP Percentile --      Pulse Rate 02/24/23 1248 93     Resp 02/24/23 1247 17     Temp 02/24/23 1247 98.3 F (36.8 C)     Temp Source 02/24/23 1247 Oral     SpO2 02/24/23 1248 98 %     Weight 02/24/23 1249 149 lb 14.6 oz (68 kg)     Height 02/24/23 1249 5\' 9"  (1.753 m)     Head Circumference --      Peak Flow --      Pain Score 02/24/23 1248 9     Pain Loc --      Pain Education --      Exclude from Growth Chart --     Most recent vital signs: Vitals:   02/24/23 1248 02/24/23 1250  BP: (!) 141/98 123/78  Pulse: 93   Resp:    Temp:    SpO2: 98%     General: Awake, no distress.  Looks mildly uncomfortable CV:  Good peripheral perfusion.  Resp:  Normal effort.  Abd:  No distention.  Soft MSK:  No deformity noted.  Neuro:  No focal deficits appreciated. Other:     ED Results / Procedures / Treatments   Labs (all labs ordered are listed, but only abnormal results are displayed) Labs Reviewed  RESP PANEL BY RT-PCR (RSV, FLU A&B, COVID)  RVPGX2 - Abnormal; Notable for the following components:      Result Value   Influenza A by PCR POSITIVE (*)    All other components within normal limits    EKG Sinus rhythm the rate of 84 bpm.  Normal axis and intervals.  Nonspecific changes to lead III.  No recent  comparison.  No clear acute ischemic features.  RADIOLOGY   Official radiology report(s): No results found.  PROCEDURES and INTERVENTIONS:  Procedures  Medications  ketorolac (TORADOL) 30 MG/ML injection 30 mg (has no administration in time range)  ondansetron (ZOFRAN-ODT) disintegrating tablet 4 mg (has no administration in time range)     IMPRESSION / MDM / ASSESSMENT AND PLAN / ED COURSE  I reviewed the triage vital signs and the nursing notes.  Differential diagnosis includes, but is not limited to, pneumonia, ACS, viral syndrome, influenza, dehydration, appendicitis  {Patient presents with symptoms of an acute illness or injury that is potentially life-threatening.  Patient presents with 3-4 days of illness, influenza A positive and suitable for outpatient management.  Overall syndrome is most consistent with influenza generalized viral illness.  Testing positive for the flu.  He is fairly low risk, no indications for Tamiflu considering the duration of illness.  He is tolerating p.o. intake.  Will provide Zofran prescription,  Toradol here in the ED and he is suitable for outpatient management.      FINAL CLINICAL IMPRESSION(S) / ED DIAGNOSES   Final diagnoses:  Influenza A     Rx / DC Orders   ED Discharge Orders          Ordered    ondansetron (ZOFRAN-ODT) 4 MG disintegrating tablet  Every 8 hours PRN        02/24/23 1349             Note:  This document was prepared using Dragon voice recognition software and may include unintentional dictation errors.   Delton [place], MD 02/24/23 [phone]

## 2023-03-24 DIAGNOSIS — Z1331 Encounter for screening for depression: Secondary | ICD-10-CM | POA: Diagnosis not present

## 2023-03-24 DIAGNOSIS — B20 Human immunodeficiency virus [HIV] disease: Secondary | ICD-10-CM | POA: Diagnosis not present

## 2023-03-24 DIAGNOSIS — Z1389 Encounter for screening for other disorder: Secondary | ICD-10-CM | POA: Diagnosis not present

## 2023-04-27 DIAGNOSIS — D696 Thrombocytopenia, unspecified: Secondary | ICD-10-CM | POA: Diagnosis not present

## 2023-11-23 ENCOUNTER — Ambulatory Visit: Admitting: Family Medicine

## 2023-11-23 DIAGNOSIS — Z113 Encounter for screening for infections with a predominantly sexual mode of transmission: Secondary | ICD-10-CM | POA: Diagnosis not present

## 2023-11-23 NOTE — Progress Notes (Signed)
 [facility] Department STI clinic [address] [place] [address] Main phone: [phone]  STI screening visit  Subjective:  Tony Conley is a 38 y.o. male being seen today for an STI screening visit. The patient reports they do not have symptoms.    Patient has the following medical conditions:  Patient Active Problem List   Diagnosis Date Noted  . Syphilis 08/07/2020  . HIV infection (HCC) 03/26/2017   Chief Complaint  Patient presents with  . SEXUALLY TRANSMITTED DISEASE    HPI Patient reports to clinic for STI Testing. States he used some nair and shaved on his genitalia and buttocks and now has bumps. He states initially there were also bumps on his thighs but those have since gone away. My internet was not working at the time of this encounter- I have since reviewed the chart and see the patient has a known HIV infection.   See flowsheet for further details and programmatic requirements  Hyperlink available at the top of the signed note in blue.  Flow sheet content below:  Pregnancy Intention Screening Does the patient want to become pregnant in the next year?: N/A Does the patient's partner want to become pregnant in the next year?: No Would the patient like to discuss contraceptive options today?: N/A Reason For STD Screen STD Screening: Has symptoms Have you ever had an STD?: Yes History of Antibiotic use in the past 2 weeks?: No STD Symptoms Genital Itching: Yes Genital Itch s/s: x 1-2 weeks Risk Factors for Hep B Household, sexual, or needle sharing contact of a person infected with Hep B: No Sexual contact with a person who uses drugs not as prescribed?: No Currently or Ever used drugs not as prescribed: No HIV Positive: No PRep Patient: No Men who have sex with men: No Have Hepatitis C: No History of Incarceration: No History of Homeslessness?: No Anal sex following anal drug use?: No Risk Factors for Hep  C Currently using drugs not as prescribed: No Sexual partner(s) currently using drugs as not prescribed: No History of drug use: No HIV Positive: No People with a history of incarceration: No People born between the years of 31 and 15: No Abuse History Has patient ever been abused physically?: No Has patient ever been abused sexually?: No Does patient feel they have a problem with Anxiety?: No Does patient feel they have a problem with Depression?: No Counseling Patient counseled to use condoms with all sex: Condoms given RTC in 2-3 weeks for test results: Yes Clinic will call if test results abnormal before test result appt.: Yes Test results given to patient Patient counseled to use condoms with all sex: Condoms given  Screening for MPX risk:  Unexplained rash?  No   MSM?  No   Multiple or anonymous sex partners?  No   Any close or sexual contact with a person  diagnosed with MPX?  No   Any outside the [place]  where MPX is endemic?  No   High clinical suspicion for MPX?    -Unlikely to be chickenpox    -Lymphadenopathy    -Rash that presents in same phase of       evolution on any given body part  No   STI screening history: Last HIV test per patient/review of record was No results found for: [facility]HIVSCREEN  Lab Results  Component Value Date   HIV Reactive (A) 03/16/2017    Last HEPC test per patient/review of record was No  results found for: HMHEPCSCREEN No components found for: HEPC   Last HEPB test per patient/review of record was No components found for: HMHEPBSCREEN   Fertility: Does the patient or their partner desires a pregnancy in the next year? No  Immunization History  Administered Date(s) Administered  . Moderna Sars-Covid-2 Vaccination 02/02/2020, 01/10/2021    The following portions of the patient's history were reviewed and updated as appropriate: allergies, current medications, past medical history, past social history, past surgical history and  problem list.  Objective:  There were no vitals filed for this visit.  Physical Exam Vitals and nursing note reviewed. Exam conducted with a chaperone present Brett Orange CNA).  Constitutional:      Appearance: Normal appearance.  HENT:     Head: Normocephalic and atraumatic.     Mouth/Throat:     Mouth: Mucous membranes are moist.     Pharynx: No oropharyngeal exudate or posterior oropharyngeal erythema.  Eyes:     General:        Right eye: No discharge.        Left eye: No discharge.     Conjunctiva/sclera:     Right eye: Right conjunctiva is not injected. No exudate.    Left eye: Left conjunctiva is not injected. No exudate. Pulmonary:     Effort: Pulmonary effort is normal.  Abdominal:     General: Abdomen is flat.     Palpations: Abdomen is soft. There is no hepatomegaly or mass.     Tenderness: There is no abdominal tenderness. There is no rebound.  Genitourinary:    Comments: Declined penile exam- asymptomatic- desired exam of buttocks only  Few scattered pimples on buttocks Lymphadenopathy:     Cervical: No cervical adenopathy.     Upper Body:     Right upper body: No supraclavicular or axillary adenopathy.     Left upper body: No supraclavicular or axillary adenopathy.  Skin:    General: Skin is warm and dry.  Neurological:     Mental Status: He is alert and oriented to person, place, and time.      Assessment and Plan:  Tony Conley is a 38 y.o. male presenting to the [facility] Department for STI screening  1. Screening for venereal disease (Primary) -declined to do blood work after being blood work can take 2-3 weeks for result  - Chlamydia/GC NAA, Confirmation   Patient does not have STI symptoms Patient accepted the following screenings: urine CT/GC Patient meets criteria for HepB screening? No. Ordered? not applicable Patient meets criteria for HepC screening? No. Ordered? not applicable Recommended condom use with all  sex Discussed importance of condom use for STI prevention  Treat positive test results per standing order. Discussed time line for [facility] results and that patient will be called with positive results and encouraged patient to call if he had not heard in 2 weeks Recommended repeat testing in 3 months with positive results. Recommended returning for continued or worsening symptoms.   Return if symptoms worsen or fail to improve, for STI screening.  No future appointments.  Verneta Bers, [place]

## 2023-11-23 NOTE — Progress Notes (Signed)
 Pt is here for std screening. Larraine JONELLE Novak, RN

## 2023-11-24 ENCOUNTER — Ambulatory Visit

## 2023-11-25 LAB — CHLAMYDIA/GC NAA, CONFIRMATION
Chlamydia trachomatis, NAA: NEGATIVE
Neisseria gonorrhoeae, NAA: NEGATIVE
